# Patient Record
Sex: Female | Born: 1965 | Race: White | Hispanic: No | Smoking: Current every day smoker
Health system: Southern US, Community
[De-identification: ages and names within clinical notes are randomized; demographics above are authoritative.]

## PROBLEM LIST (undated history)

## (undated) DIAGNOSIS — F3181 Bipolar II disorder: Secondary | ICD-10-CM

## (undated) DIAGNOSIS — Z87442 Personal history of urinary calculi: Secondary | ICD-10-CM

## (undated) DIAGNOSIS — F329 Major depressive disorder, single episode, unspecified: Secondary | ICD-10-CM

## (undated) DIAGNOSIS — K219 Gastro-esophageal reflux disease without esophagitis: Secondary | ICD-10-CM

## (undated) DIAGNOSIS — F32A Depression, unspecified: Secondary | ICD-10-CM

## (undated) DIAGNOSIS — F1910 Other psychoactive substance abuse, uncomplicated: Secondary | ICD-10-CM

## (undated) DIAGNOSIS — F909 Attention-deficit hyperactivity disorder, unspecified type: Secondary | ICD-10-CM

## (undated) HISTORY — PX: KIDNEY STONE SURGERY: SHX686

## (undated) HISTORY — PX: ABDOMINAL HYSTERECTOMY: SHX81

---

## 1997-12-04 ENCOUNTER — Emergency Department (HOSPITAL_COMMUNITY): Admission: RE | Admit: 1997-12-04 | Discharge: 1997-12-04 | Payer: Self-pay | Admitting: Emergency Medicine

## 1998-05-14 ENCOUNTER — Emergency Department (HOSPITAL_COMMUNITY): Admission: EM | Admit: 1998-05-14 | Discharge: 1998-05-14 | Payer: Self-pay | Admitting: Emergency Medicine

## 1998-05-24 ENCOUNTER — Ambulatory Visit (HOSPITAL_COMMUNITY): Admission: RE | Admit: 1998-05-24 | Discharge: 1998-05-24 | Payer: Self-pay | Admitting: Internal Medicine

## 1998-05-27 ENCOUNTER — Emergency Department (HOSPITAL_COMMUNITY): Admission: EM | Admit: 1998-05-27 | Discharge: 1998-05-27 | Payer: Self-pay | Admitting: Emergency Medicine

## 1998-05-28 ENCOUNTER — Emergency Department (HOSPITAL_COMMUNITY): Admission: EM | Admit: 1998-05-28 | Discharge: 1998-05-28 | Payer: Self-pay | Admitting: Emergency Medicine

## 1998-05-31 ENCOUNTER — Emergency Department (HOSPITAL_COMMUNITY): Admission: EM | Admit: 1998-05-31 | Discharge: 1998-05-31 | Payer: Self-pay | Admitting: Emergency Medicine

## 1998-06-02 ENCOUNTER — Emergency Department (HOSPITAL_COMMUNITY): Admission: EM | Admit: 1998-06-02 | Discharge: 1998-06-02 | Payer: Self-pay | Admitting: Emergency Medicine

## 1998-06-03 ENCOUNTER — Emergency Department (HOSPITAL_COMMUNITY): Admission: EM | Admit: 1998-06-03 | Discharge: 1998-06-03 | Payer: Self-pay | Admitting: Emergency Medicine

## 1998-07-06 ENCOUNTER — Emergency Department (HOSPITAL_COMMUNITY): Admission: EM | Admit: 1998-07-06 | Discharge: 1998-07-06 | Payer: Self-pay | Admitting: Emergency Medicine

## 1998-07-08 ENCOUNTER — Inpatient Hospital Stay (HOSPITAL_COMMUNITY): Admission: EM | Admit: 1998-07-08 | Discharge: 1998-07-11 | Payer: Self-pay | Admitting: Emergency Medicine

## 1998-08-19 ENCOUNTER — Emergency Department (HOSPITAL_COMMUNITY): Admission: EM | Admit: 1998-08-19 | Discharge: 1998-08-19 | Payer: Self-pay | Admitting: Emergency Medicine

## 1998-08-19 ENCOUNTER — Encounter: Payer: Self-pay | Admitting: Emergency Medicine

## 1998-12-12 ENCOUNTER — Emergency Department (HOSPITAL_COMMUNITY): Admission: EM | Admit: 1998-12-12 | Discharge: 1998-12-12 | Payer: Self-pay | Admitting: Emergency Medicine

## 1999-02-24 ENCOUNTER — Emergency Department (HOSPITAL_COMMUNITY): Admission: EM | Admit: 1999-02-24 | Discharge: 1999-02-24 | Payer: Self-pay | Admitting: Emergency Medicine

## 1999-03-03 ENCOUNTER — Encounter: Payer: Self-pay | Admitting: Emergency Medicine

## 1999-03-03 ENCOUNTER — Emergency Department (HOSPITAL_COMMUNITY): Admission: EM | Admit: 1999-03-03 | Discharge: 1999-03-03 | Payer: Self-pay | Admitting: Emergency Medicine

## 1999-03-06 ENCOUNTER — Encounter: Payer: Self-pay | Admitting: Emergency Medicine

## 1999-03-06 ENCOUNTER — Emergency Department (HOSPITAL_COMMUNITY): Admission: EM | Admit: 1999-03-06 | Discharge: 1999-03-06 | Payer: Self-pay | Admitting: Emergency Medicine

## 1999-03-19 ENCOUNTER — Emergency Department (HOSPITAL_COMMUNITY): Admission: EM | Admit: 1999-03-19 | Discharge: 1999-03-19 | Payer: Self-pay | Admitting: Emergency Medicine

## 1999-04-02 ENCOUNTER — Emergency Department (HOSPITAL_COMMUNITY): Admission: EM | Admit: 1999-04-02 | Discharge: 1999-04-02 | Payer: Self-pay

## 1999-05-13 ENCOUNTER — Encounter: Payer: Self-pay | Admitting: Emergency Medicine

## 1999-05-13 ENCOUNTER — Emergency Department (HOSPITAL_COMMUNITY): Admission: EM | Admit: 1999-05-13 | Discharge: 1999-05-13 | Payer: Self-pay | Admitting: Emergency Medicine

## 1999-06-13 ENCOUNTER — Emergency Department (HOSPITAL_COMMUNITY): Admission: EM | Admit: 1999-06-13 | Discharge: 1999-06-13 | Payer: Self-pay | Admitting: Emergency Medicine

## 1999-09-16 ENCOUNTER — Encounter: Payer: Self-pay | Admitting: Emergency Medicine

## 1999-09-16 ENCOUNTER — Emergency Department (HOSPITAL_COMMUNITY): Admission: EM | Admit: 1999-09-16 | Discharge: 1999-09-16 | Payer: Self-pay | Admitting: Emergency Medicine

## 1999-09-29 ENCOUNTER — Encounter: Payer: Self-pay | Admitting: Emergency Medicine

## 1999-09-29 ENCOUNTER — Emergency Department (HOSPITAL_COMMUNITY): Admission: EM | Admit: 1999-09-29 | Discharge: 1999-09-29 | Payer: Self-pay | Admitting: Emergency Medicine

## 1999-10-30 ENCOUNTER — Inpatient Hospital Stay: Admission: EM | Admit: 1999-10-30 | Discharge: 1999-10-30 | Payer: Self-pay

## 1999-10-31 ENCOUNTER — Inpatient Hospital Stay (HOSPITAL_COMMUNITY): Admission: AD | Admit: 1999-10-31 | Discharge: 1999-11-06 | Payer: Self-pay | Admitting: *Deleted

## 2000-09-11 ENCOUNTER — Emergency Department (HOSPITAL_COMMUNITY): Admission: EM | Admit: 2000-09-11 | Discharge: 2000-09-11 | Payer: Self-pay | Admitting: Emergency Medicine

## 2000-09-11 ENCOUNTER — Encounter: Payer: Self-pay | Admitting: Emergency Medicine

## 2000-10-07 ENCOUNTER — Emergency Department (HOSPITAL_COMMUNITY): Admission: EM | Admit: 2000-10-07 | Discharge: 2000-10-07 | Payer: Self-pay | Admitting: Emergency Medicine

## 2000-10-12 ENCOUNTER — Emergency Department (HOSPITAL_COMMUNITY): Admission: EM | Admit: 2000-10-12 | Discharge: 2000-10-13 | Payer: Self-pay | Admitting: Emergency Medicine

## 2000-11-19 ENCOUNTER — Emergency Department (HOSPITAL_COMMUNITY): Admission: EM | Admit: 2000-11-19 | Discharge: 2000-11-20 | Payer: Self-pay | Admitting: Emergency Medicine

## 2000-11-19 ENCOUNTER — Encounter: Payer: Self-pay | Admitting: Emergency Medicine

## 2001-05-18 ENCOUNTER — Emergency Department (HOSPITAL_COMMUNITY): Admission: EM | Admit: 2001-05-18 | Discharge: 2001-05-18 | Payer: Self-pay | Admitting: Emergency Medicine

## 2001-05-25 ENCOUNTER — Emergency Department (HOSPITAL_COMMUNITY): Admission: EM | Admit: 2001-05-25 | Discharge: 2001-05-25 | Payer: Self-pay | Admitting: Emergency Medicine

## 2001-06-20 ENCOUNTER — Encounter: Payer: Self-pay | Admitting: Endocrinology

## 2001-06-20 ENCOUNTER — Encounter: Admission: RE | Admit: 2001-06-20 | Discharge: 2001-06-20 | Payer: Self-pay | Admitting: Endocrinology

## 2002-03-09 ENCOUNTER — Emergency Department (HOSPITAL_COMMUNITY): Admission: EM | Admit: 2002-03-09 | Discharge: 2002-03-09 | Payer: Self-pay | Admitting: Emergency Medicine

## 2002-04-03 ENCOUNTER — Emergency Department (HOSPITAL_COMMUNITY): Admission: EM | Admit: 2002-04-03 | Discharge: 2002-04-03 | Payer: Self-pay | Admitting: Emergency Medicine

## 2002-04-03 ENCOUNTER — Encounter: Payer: Self-pay | Admitting: Emergency Medicine

## 2002-05-31 ENCOUNTER — Emergency Department (HOSPITAL_COMMUNITY): Admission: EM | Admit: 2002-05-31 | Discharge: 2002-05-31 | Payer: Self-pay | Admitting: *Deleted

## 2002-10-04 ENCOUNTER — Encounter: Payer: Self-pay | Admitting: Emergency Medicine

## 2002-10-04 ENCOUNTER — Emergency Department (HOSPITAL_COMMUNITY): Admission: EM | Admit: 2002-10-04 | Discharge: 2002-10-04 | Payer: Self-pay | Admitting: Emergency Medicine

## 2002-11-09 ENCOUNTER — Encounter: Payer: Self-pay | Admitting: Emergency Medicine

## 2002-11-09 ENCOUNTER — Emergency Department (HOSPITAL_COMMUNITY): Admission: EM | Admit: 2002-11-09 | Discharge: 2002-11-09 | Payer: Self-pay | Admitting: Emergency Medicine

## 2002-12-21 ENCOUNTER — Emergency Department (HOSPITAL_COMMUNITY): Admission: EM | Admit: 2002-12-21 | Discharge: 2002-12-21 | Payer: Self-pay

## 2003-01-27 ENCOUNTER — Encounter: Payer: Self-pay | Admitting: Emergency Medicine

## 2003-01-27 ENCOUNTER — Emergency Department (HOSPITAL_COMMUNITY): Admission: EM | Admit: 2003-01-27 | Discharge: 2003-01-27 | Payer: Self-pay | Admitting: Emergency Medicine

## 2003-06-19 ENCOUNTER — Emergency Department (HOSPITAL_COMMUNITY): Admission: AD | Admit: 2003-06-19 | Discharge: 2003-06-19 | Payer: Self-pay | Admitting: Emergency Medicine

## 2003-07-01 ENCOUNTER — Emergency Department (HOSPITAL_COMMUNITY): Admission: EM | Admit: 2003-07-01 | Discharge: 2003-07-02 | Payer: Self-pay

## 2003-12-21 ENCOUNTER — Emergency Department (HOSPITAL_COMMUNITY): Admission: EM | Admit: 2003-12-21 | Discharge: 2003-12-21 | Payer: Self-pay | Admitting: Emergency Medicine

## 2004-01-01 ENCOUNTER — Emergency Department (HOSPITAL_COMMUNITY): Admission: EM | Admit: 2004-01-01 | Discharge: 2004-01-02 | Payer: Self-pay | Admitting: *Deleted

## 2004-01-16 ENCOUNTER — Emergency Department (HOSPITAL_COMMUNITY): Admission: EM | Admit: 2004-01-16 | Discharge: 2004-01-16 | Payer: Self-pay | Admitting: Emergency Medicine

## 2004-03-02 ENCOUNTER — Emergency Department (HOSPITAL_COMMUNITY): Admission: EM | Admit: 2004-03-02 | Discharge: 2004-03-03 | Payer: Self-pay | Admitting: Emergency Medicine

## 2004-03-31 ENCOUNTER — Emergency Department (HOSPITAL_COMMUNITY): Admission: EM | Admit: 2004-03-31 | Discharge: 2004-03-31 | Payer: Self-pay | Admitting: Emergency Medicine

## 2004-04-08 ENCOUNTER — Emergency Department (HOSPITAL_COMMUNITY): Admission: EM | Admit: 2004-04-08 | Discharge: 2004-04-09 | Payer: Self-pay | Admitting: Emergency Medicine

## 2004-09-02 ENCOUNTER — Emergency Department (HOSPITAL_COMMUNITY): Admission: EM | Admit: 2004-09-02 | Discharge: 2004-09-02 | Payer: Self-pay | Admitting: Emergency Medicine

## 2004-09-02 ENCOUNTER — Emergency Department (HOSPITAL_COMMUNITY): Admission: EM | Admit: 2004-09-02 | Discharge: 2004-09-03 | Payer: Self-pay | Admitting: Emergency Medicine

## 2004-11-22 ENCOUNTER — Emergency Department (HOSPITAL_COMMUNITY): Admission: EM | Admit: 2004-11-22 | Discharge: 2004-11-23 | Payer: Self-pay | Admitting: Emergency Medicine

## 2005-02-12 ENCOUNTER — Observation Stay (HOSPITAL_COMMUNITY): Admission: EM | Admit: 2005-02-12 | Discharge: 2005-02-13 | Payer: Self-pay | Admitting: Emergency Medicine

## 2005-02-12 ENCOUNTER — Ambulatory Visit: Payer: Self-pay | Admitting: Infectious Diseases

## 2005-02-12 ENCOUNTER — Ambulatory Visit: Payer: Self-pay | Admitting: Critical Care Medicine

## 2005-03-11 ENCOUNTER — Emergency Department (HOSPITAL_COMMUNITY): Admission: EM | Admit: 2005-03-11 | Discharge: 2005-03-11 | Payer: Self-pay | Admitting: Emergency Medicine

## 2005-03-27 ENCOUNTER — Emergency Department (HOSPITAL_COMMUNITY): Admission: EM | Admit: 2005-03-27 | Discharge: 2005-03-27 | Payer: Self-pay | Admitting: Emergency Medicine

## 2005-09-18 ENCOUNTER — Emergency Department (HOSPITAL_COMMUNITY): Admission: EM | Admit: 2005-09-18 | Discharge: 2005-09-19 | Payer: Self-pay | Admitting: Emergency Medicine

## 2005-09-19 ENCOUNTER — Emergency Department (HOSPITAL_COMMUNITY): Admission: EM | Admit: 2005-09-19 | Discharge: 2005-09-19 | Payer: Self-pay | Admitting: Emergency Medicine

## 2006-07-21 ENCOUNTER — Emergency Department (HOSPITAL_COMMUNITY): Admission: EM | Admit: 2006-07-21 | Discharge: 2006-07-21 | Payer: Self-pay | Admitting: Emergency Medicine

## 2006-07-23 ENCOUNTER — Ambulatory Visit (HOSPITAL_COMMUNITY): Admission: RE | Admit: 2006-07-23 | Discharge: 2006-07-23 | Payer: Self-pay | Admitting: Emergency Medicine

## 2006-08-18 ENCOUNTER — Emergency Department (HOSPITAL_COMMUNITY): Admission: EM | Admit: 2006-08-18 | Discharge: 2006-08-18 | Payer: Self-pay | Admitting: Emergency Medicine

## 2006-08-27 ENCOUNTER — Emergency Department (HOSPITAL_COMMUNITY): Admission: EM | Admit: 2006-08-27 | Discharge: 2006-08-27 | Payer: Self-pay | Admitting: Family Medicine

## 2006-10-07 ENCOUNTER — Ambulatory Visit: Payer: Self-pay | Admitting: Internal Medicine

## 2006-10-10 ENCOUNTER — Ambulatory Visit: Payer: Self-pay | Admitting: *Deleted

## 2006-10-11 ENCOUNTER — Ambulatory Visit: Payer: Self-pay | Admitting: Internal Medicine

## 2006-10-21 ENCOUNTER — Ambulatory Visit: Payer: Self-pay | Admitting: Internal Medicine

## 2006-11-18 ENCOUNTER — Ambulatory Visit: Payer: Self-pay | Admitting: Internal Medicine

## 2006-12-27 ENCOUNTER — Ambulatory Visit: Payer: Self-pay | Admitting: Internal Medicine

## 2007-01-24 ENCOUNTER — Ambulatory Visit: Payer: Self-pay | Admitting: Internal Medicine

## 2007-01-30 ENCOUNTER — Emergency Department (HOSPITAL_COMMUNITY): Admission: EM | Admit: 2007-01-30 | Discharge: 2007-01-30 | Payer: Self-pay | Admitting: Emergency Medicine

## 2007-03-07 ENCOUNTER — Ambulatory Visit: Payer: Self-pay | Admitting: Internal Medicine

## 2007-04-09 ENCOUNTER — Ambulatory Visit: Payer: Self-pay | Admitting: Internal Medicine

## 2007-05-08 ENCOUNTER — Ambulatory Visit: Payer: Self-pay | Admitting: Internal Medicine

## 2007-06-05 ENCOUNTER — Ambulatory Visit: Payer: Self-pay | Admitting: Internal Medicine

## 2007-06-08 ENCOUNTER — Emergency Department (HOSPITAL_COMMUNITY): Admission: EM | Admit: 2007-06-08 | Discharge: 2007-06-08 | Payer: Self-pay | Admitting: Emergency Medicine

## 2007-08-20 ENCOUNTER — Encounter (INDEPENDENT_AMBULATORY_CARE_PROVIDER_SITE_OTHER): Payer: Self-pay | Admitting: *Deleted

## 2007-08-28 ENCOUNTER — Ambulatory Visit: Payer: Self-pay | Admitting: Internal Medicine

## 2007-10-09 ENCOUNTER — Ambulatory Visit: Payer: Self-pay | Admitting: Internal Medicine

## 2007-12-24 ENCOUNTER — Ambulatory Visit: Payer: Self-pay | Admitting: Internal Medicine

## 2008-04-08 ENCOUNTER — Ambulatory Visit: Payer: Self-pay | Admitting: Internal Medicine

## 2008-07-15 ENCOUNTER — Ambulatory Visit: Payer: Self-pay | Admitting: Internal Medicine

## 2008-07-18 ENCOUNTER — Emergency Department (HOSPITAL_COMMUNITY): Admission: EM | Admit: 2008-07-18 | Discharge: 2008-07-19 | Payer: Self-pay | Admitting: Emergency Medicine

## 2008-07-19 ENCOUNTER — Inpatient Hospital Stay (HOSPITAL_COMMUNITY): Admission: AD | Admit: 2008-07-19 | Discharge: 2008-07-23 | Payer: Self-pay | Admitting: *Deleted

## 2008-07-19 ENCOUNTER — Ambulatory Visit: Payer: Self-pay | Admitting: *Deleted

## 2008-08-18 ENCOUNTER — Ambulatory Visit: Payer: Self-pay | Admitting: Internal Medicine

## 2008-09-16 ENCOUNTER — Inpatient Hospital Stay (HOSPITAL_COMMUNITY): Admission: RE | Admit: 2008-09-16 | Discharge: 2008-09-20 | Payer: Self-pay | Admitting: Psychiatry

## 2008-09-16 ENCOUNTER — Ambulatory Visit: Payer: Self-pay | Admitting: Psychiatry

## 2008-11-11 ENCOUNTER — Ambulatory Visit: Payer: Self-pay | Admitting: Internal Medicine

## 2008-11-13 ENCOUNTER — Emergency Department (HOSPITAL_COMMUNITY): Admission: EM | Admit: 2008-11-13 | Discharge: 2008-11-14 | Payer: Self-pay | Admitting: Emergency Medicine

## 2008-11-14 ENCOUNTER — Ambulatory Visit: Payer: Self-pay | Admitting: Psychiatry

## 2008-11-14 ENCOUNTER — Inpatient Hospital Stay (HOSPITAL_COMMUNITY): Admission: AD | Admit: 2008-11-14 | Discharge: 2008-11-18 | Payer: Self-pay | Admitting: Psychiatry

## 2008-11-18 ENCOUNTER — Encounter (HOSPITAL_COMMUNITY): Payer: Self-pay | Admitting: Psychiatry

## 2008-11-18 ENCOUNTER — Inpatient Hospital Stay (HOSPITAL_COMMUNITY): Admission: EM | Admit: 2008-11-18 | Discharge: 2008-11-22 | Payer: Self-pay | Admitting: Emergency Medicine

## 2008-11-22 ENCOUNTER — Ambulatory Visit: Payer: Self-pay | Admitting: *Deleted

## 2008-11-22 ENCOUNTER — Inpatient Hospital Stay (HOSPITAL_COMMUNITY): Admission: RE | Admit: 2008-11-22 | Discharge: 2008-11-25 | Payer: Self-pay | Admitting: *Deleted

## 2008-12-20 ENCOUNTER — Emergency Department (HOSPITAL_COMMUNITY): Admission: EM | Admit: 2008-12-20 | Discharge: 2008-12-21 | Payer: Self-pay | Admitting: Emergency Medicine

## 2009-01-21 ENCOUNTER — Inpatient Hospital Stay (HOSPITAL_COMMUNITY): Admission: EM | Admit: 2009-01-21 | Discharge: 2009-01-31 | Payer: Self-pay | Admitting: Psychiatry

## 2009-01-21 ENCOUNTER — Ambulatory Visit: Payer: Self-pay | Admitting: Psychiatry

## 2009-01-21 ENCOUNTER — Emergency Department (HOSPITAL_COMMUNITY): Admission: EM | Admit: 2009-01-21 | Discharge: 2009-01-21 | Payer: Self-pay | Admitting: Emergency Medicine

## 2009-01-25 ENCOUNTER — Ambulatory Visit (HOSPITAL_COMMUNITY): Admission: RE | Admit: 2009-01-25 | Discharge: 2009-01-25 | Payer: Self-pay | Admitting: Psychiatry

## 2009-03-19 ENCOUNTER — Emergency Department (HOSPITAL_COMMUNITY): Admission: EM | Admit: 2009-03-19 | Discharge: 2009-03-19 | Payer: Self-pay | Admitting: Emergency Medicine

## 2009-05-12 ENCOUNTER — Ambulatory Visit: Payer: Self-pay | Admitting: Internal Medicine

## 2010-12-24 ENCOUNTER — Encounter: Payer: Self-pay | Admitting: Emergency Medicine

## 2010-12-24 ENCOUNTER — Encounter: Payer: Self-pay | Admitting: Internal Medicine

## 2010-12-25 ENCOUNTER — Encounter: Payer: Self-pay | Admitting: Internal Medicine

## 2011-03-14 LAB — URINE MICROSCOPIC-ADD ON

## 2011-03-14 LAB — URINALYSIS, ROUTINE W REFLEX MICROSCOPIC
Specific Gravity, Urine: 1.028 (ref 1.005–1.030)
pH: 5.5 (ref 5.0–8.0)

## 2011-03-14 LAB — POCT PREGNANCY, URINE: Preg Test, Ur: NEGATIVE

## 2011-03-19 LAB — DIFFERENTIAL
Basophils Relative: 0 % (ref 0–1)
Eosinophils Relative: 7 % — ABNORMAL HIGH (ref 0–5)
Lymphs Abs: 1.9 10*3/uL (ref 0.7–4.0)
Neutrophils Relative %: 55 % (ref 43–77)

## 2011-03-19 LAB — COMPREHENSIVE METABOLIC PANEL
CO2: 28 mEq/L (ref 19–32)
Chloride: 105 mEq/L (ref 96–112)
Creatinine, Ser: 0.93 mg/dL (ref 0.4–1.2)
GFR calc non Af Amer: >60 mL/min (ref >60)
Glucose, Bld: 113 mg/dL — ABNORMAL HIGH (ref 70–99)
Sodium: 142 mEq/L (ref 135–145)
Total Protein: 7 g/dL (ref 6.0–8.3)

## 2011-03-19 LAB — CBC
HCT: 35.1 % — ABNORMAL LOW (ref 36.0–46.0)
RDW: 15.8 % — ABNORMAL HIGH (ref 11.5–15.5)
WBC: 6.2 10*3/uL (ref 4.0–10.5)

## 2011-03-19 LAB — LIPASE, BLOOD: Lipase: 26 U/L (ref 11–59)

## 2011-03-20 LAB — RAPID URINE DRUG SCREEN, HOSP PERFORMED
Cocaine: NOT DETECTED
Opiates: NOT DETECTED
Tetrahydrocannabinol: NOT DETECTED

## 2011-03-20 LAB — URINALYSIS, ROUTINE W REFLEX MICROSCOPIC
Bilirubin Urine: NEGATIVE
Glucose, UA: NEGATIVE mg/dL
Ketones, ur: NEGATIVE mg/dL
Nitrite: NEGATIVE
Protein, ur: NEGATIVE mg/dL
Protein, ur: NEGATIVE mg/dL
Urobilinogen, UA: 0.2 mg/dL (ref 0.0–1.0)

## 2011-03-20 LAB — DIFFERENTIAL
Basophils Absolute: 0 10*3/uL (ref 0.0–0.1)
Basophils Relative: 1 % (ref 0–1)
Eosinophils Absolute: 0.2 10*3/uL (ref 0.0–0.7)
Eosinophils Absolute: 0.2 10*3/uL (ref 0.0–0.7)
Eosinophils Relative: 2 % (ref 0–5)
Eosinophils Relative: 3 % (ref 0–5)
Lymphs Abs: 1.4 10*3/uL (ref 0.7–4.0)

## 2011-03-20 LAB — CBC
HCT: 32.3 % — ABNORMAL LOW (ref 36.0–46.0)
HCT: 33.1 % — ABNORMAL LOW (ref 36.0–46.0)
MCV: 79 fL (ref 78.0–100.0)
MCV: 80 fL (ref 78.0–100.0)
Platelets: 273 10*3/uL (ref 150–400)
Platelets: 291 10*3/uL (ref 150–400)
RDW: 16 % — ABNORMAL HIGH (ref 11.5–15.5)
WBC: 6.5 10*3/uL (ref 4.0–10.5)

## 2011-03-20 LAB — BASIC METABOLIC PANEL
BUN: 9 mg/dL (ref 6–23)
Chloride: 103 mEq/L (ref 96–112)
Glucose, Bld: 69 mg/dL — ABNORMAL LOW (ref 70–99)
Potassium: 4.3 mEq/L (ref 3.5–5.1)

## 2011-03-20 LAB — PREGNANCY, URINE: Preg Test, Ur: NEGATIVE

## 2011-04-17 NOTE — Discharge Summary (Signed)
NAMECAIDEN, MONSIVAIS               ACCOUNT NO.:  [id number]   MEDICAL RECORD NO.:  [id number]          PATIENT TYPE:  IPS   LOCATION:  0504                          FACILITY:  [facility]   PHYSICIAN:  Geoffery Lyons, M.D.      DATE OF BIRTH:  04/05/1966   DATE OF ADMISSION:  01/21/2009  DATE OF DISCHARGE:  01/31/2009                               DISCHARGE SUMMARY   CHIEF COMPLAINT AND PRESENT ILLNESS:  This was one of several admissions  to [facility] for this 45 year old divorced white  female.  According to the EMS, they were called to the methadone clinic  as they felt she was suicidal.  She had an argument with her daughter  the night before, had not gotten any sleep due to Klonopin along with  her methadone 125 mg per day.  She denied being suicidal, or homicidal  ideas.  Endorsed she was upset about the fight with her daughter.  She  was drowsy.  Her speech was slurred.  Claimed that the methadone helps  with the craving, that she continues to use benzodiazepines.  She gets  Klonopin 3 times a day, methadone 125 mg per day.  They had increased it  to 130 but they decreased it back at she was a little drowsy.  She also  takes Toradol 200 mg per day.  UDS positive for benzodiazepines.   PAST PSYCHIATRIC HISTORY:  __________ 2004.  Methadone clinic since  February 2009.  Last admission to Behavior Health in October.   ALCOHOL AND DRUG HISTORY:  On maintenance treatment with methadone as  well as opioids, Ambien, as well as benzodiazepines.   MEDICATIONS:  1. Zoloft 200 mg per day.  2. Seroquel 200 mg at bedtime and 200 mg twice a day.  3. Klonopin 0.5 every 8 hours as needed.  4. Methadone from the methadone clinic 125 mg per day.   PHYSICAL EXAMINATION:  Failed to show any acute findings.   OTHER LABORATORY WORKUP:  White blood cells 6.5, hemoglobin 10.5.  UA:  White blood cells 21-50.   MENTAL STATUS EXAMINATION:  Revealed alert, cooperative female, casually  groomed and dressed.  Speech was normal in rate, tempo and production.  Mood anxious, depressed, affect depressed, tearful.  Thought processes  are clear, rational and goal oriented.  No active suicidal or homicidal  ideas, no delusions.  No hallucinations.  Cognition well-preserved.   ADMISSION DIAGNOSES:  Axis I:  A.  Major depressive disorder.  B.  Opiate dependence on maintenance with methadone.  Axis II:  No diagnosis.  Axis III:  Degenerative disk disease.  Axis IV:  Moderate.  Axis V:  Upon admission 35-40, highest GAF in the last year 60.   COURSE IN THE HOSPITAL:  She was admitted.  She was started in  individual and group psychotherapy.  She was maintained on her  medications.  She was treated with Cipro for the urinary tract  infection.  She was eventually placed on Wellbutrin.  She did admit to  depression and anxiety.  January 24, 2009  she continued to deal  with  all the environmental stressors, frustrated because she has not been  able to get a place to live where she can get her daughter to stay with  her and be away from the female she lives with, which is a very stressful  situation.  She had developed some cough, was assessed by the nurse  practitioner.  She has prior history of pneumonia.  January 17, 2009  she continued to have a hard time with depression in that she was going  down, becoming suicidal.  We pursued the Wellbutrin.  On the 26th  continued to endorse a depressed mood, endorsed fluctuation with the  depression,with suicidal thought, but did say she would not hurt  herself.  January 29, 2009 endorsed that she might have seen a decrease  in the suicidal thoughts.  No side effects to the medication.  Still  underlying depressed, but endorsed she was maybe starting to feel some  better.  The next 48 hours she improved, marked decrease in the  depression, no suicidal thoughts.  So on January 31, 2009 we went ahead and  discharged to outpatient treatment.    DISCHARGE DIAGNOSES:  Axis I:  A.  Major depressive disorder.  1. Mood disorder, not otherwise specified.  2. Anxiety disorder, not otherwise specified.  3. Opiate dependency, maintenance treatment with methadone.  Axis II:  No diagnosis.  Axis III:  Degenerative disk disease.  Axis IV:  Moderate.  Axis V:  Upon discharge 55-60.   Discharged on:  1. Zoloft 200 mg per day.  2. Wellbutrin XL 150 mg per day.  3. Protonix 40 mg per day.  4. Seroquel 200 mg twice a day and 400 mg at bedtime.  5. Methadone 120 mg per day.  6. Klonopin 0.5 twice a day and at night as needed.  7. Flovent 2 puffs twice a day.  8. Albuterol inhaler 2 puffs 3 times daily as needed.   FOLLOWUP:  By Dala Dock and Dr. Lang Snow at [facility].      Geoffery Lyons, M.D.  Electronically Signed     IL/MEDQ  D:  02/25/2009  T:  02/25/2009  Job:  [id number]

## 2011-04-17 NOTE — H&P (Signed)
[name], MORDAN NO.:  [id number]   MEDICAL RECORD NO.:  [id number]          PATIENT TYPE:  IPS   LOCATION:  0307                          FACILITY:  [facility]   PHYSICIAN:  Jasmine Pang, M.D. DATE OF BIRTH:  05-26-1966   DATE OF ADMISSION:  07/19/2008  DATE OF DISCHARGE:                       PSYCHIATRIC ADMISSION ASSESSMENT   A 45 year old female that is voluntarily on July 18, 2008.   HISTORY OF PRESENT ILLNESS:  The patient reports that she has been  depressed and said that she just could not take any more.  She has  been having so much going on in her life that her brother is ill, her  Asa Lente is due and having financial issues.  She was experiencing  racing thoughts,  mood swings, feeling again very depressed and anxious,  endorsing panic attacks.  Her sleep has been decreased.  Appetite has  been satisfactory.   PAST PSYCHIATRIC HISTORY:  First admission to [facility].  No  current outpatient mental health treatment.  She is currently in a  methadone treatment center.  Her past medications have included  trazodone which she reports problems with dreams.   SOCIAL HISTORY:  A 45 year old female.  She is currently living with her  brother and lives in [place].  She is unemployed.  She has a 19-year-  old daughter and 2 sons ages 38 and 20.  The patient states she is  trying to get disability for psychiatric issues.   FAMILY HISTORY:  Unclear.   ALCOHOL/DRUG HISTORY:  The patient smokes.  No alcohol use.  Denies any  substance use.   PRIMARY CARE Helen Cuff:  [facility], sees Dr. Reche Dixon.   MEDICAL PROBLEMS:  Chronic pain.   MEDICATIONS:  1. Methadone 80 mg daily.  2. Zoloft 100 mg prescribed by Dr. Reche Dixon.  3. Klonopin 1 mg t.i.d. prescribed by [facility].   DRUG ALLERGIES:  PENICILLIN.   PHYSICAL EXAMINATION:  The patient was fully assessed at [facility]  Emergency Department.  She did receive Xanax.  Her temperature is  97.9,  100 heart rate, 18 respirations, blood pressure is 129/73, 65 inches  tall, 160 pounds.   LABORATORY DATA:  Glucose of 112.  Urinalysis is negative.  Alcohol  level less than 5.  Urine drug screen is positive for opiates, positive  for benzodiazepines.   MENTAL STATUS EXAM:  The patient is alert, awake, casually dressed, good  eye contact, appears very anxious.  Her speech is clear, normal pace and  tone.  Her thought processes are coherent.  No evidence of psychosis.  No delusional statements.  Cognitive function is intact.  Her judgment  and insight appear to be good.   AXIS I:  Major depressive disorder.  AXIS II:  Deferred.  AXIS III:  Chronic pain.  AXIS IV:  Psychosocial problems.  Medical problems.  AXIS V:  Current is 40.   PLAN:  Contract for safety.  Stabilize mood and thinking.  We will  clarify her medications.  We will continue with the patient's Zoloft.  The patient may benefit from Abilify to  augment her Zoloft.  She will be  in the blue group.  We will continue to assess comorbidities and  identify her support.  Tentative length of stay is 2-3 days.      Landry Corporal, N.P.      Jasmine Pang, M.D.  Electronically Signed    JO/MEDQ  D:  07/20/2008  T:  07/20/2008  Job:  [id number]

## 2011-04-17 NOTE — Consult Note (Signed)
[facility]               ACCOUNT NO.:  [id number]   MEDICAL RECORD NO.:  [id number]          PATIENT TYPE:  INP   LOCATION:  1532                         FACILITY:  [facility]   PHYSICIAN:  Antonietta Breach, M.D.  DATE OF BIRTH:  February 05, 1966   DATE OF CONSULTATION:  11/22/2008  DATE OF DISCHARGE:                                 CONSULTATION   Ms. Milhouse does continue with depressed mood, poor energy, difficulty  concentrating, anhedonia and suicidal thoughts.   MENTAL STATUS EXAM:  During the examination the patient begins to sob,  discussing how she would be at risk for suicide outside the hospital.  She is alert.  Her eye contact is intermittent.  Her attention span is  normal.  However, her concentration is decreased.  Affect is constricted  with tears.  Mood is depressed.  She is oriented to all spheres.  Memory  is intact to immediate, recent and remote.  Fund of knowledge and  intelligence normal.  Speech involves normal rate and prosody without  dysarthria.  Thought process is logical, coherent, goal-directed without  looseness of associations.  Thought content as above.  Insight is intact  for depression.  Judgment is intact.   ASSESSMENT:  Axis I:  293.83  Mood disorder not otherwise specified, severe depression, rule  out major depressive disorder.  History of the opioid dependence.   Ms. Popson is still at risk for suicide.   RECOMMENDATIONS:  1. Would admit to an inpatient psychiatric ward for further evaluation      and treatment.  2. No psychotropic change recommendations at this time.      Antonietta Breach, M.D.  Electronically Signed     JW/MEDQ  D:  11/22/2008  T:  11/22/2008  Job:  [id number]

## 2011-04-17 NOTE — Discharge Summary (Signed)
[name], EDICK               ACCOUNT NO.:  [id number]   MEDICAL RECORD NO.:  [id number]          PATIENT TYPE:  INP   LOCATION:  1532                         [facility]ACILITY:  [facility]   PHYSICIAN:  Altha Harm, MDDATE OF BIRTH:  [birth year]   DATE OF ADMISSION:  11/18/2008  DATE OF DISCHARGE:  11/22/2008                               DISCHARGE SUMMARY   DISCHARGE DISPOSITION:  Inpatient behavioral health.   DISCHARGE DIAGNOSES:  1. Community-acquired pneumonia.  2. Opioid dependence.  3. Depression and suicidal ideation.   DISCHARGE MEDICATIONS:  1. Klonopin 0.5 mg p.o. b.i.d. and 0.5 mg p.o. nightly.  2. Seroquel 50 mg p.o. q.6 hours and 50 mg p.o. nightly.  3. Trazodone 50 mg p.o. nightly.  4. Methadone 110 mg p.o. daily.  5. Zoloft 200 mg p.o. daily in the a.m.  6. Avelox 400 mg p.o. daily x2.   CONSULTANTS:  Dr. Antonietta Breach, psychiatry.   PROCEDURES:  None.   DIAGNOSTIC STUDIES:  A 2-view chest x-ray done on admission which shows  normal densities in the right middle lobe and right lower lobe, probable  acute pneumonia.   CODE STATUS:  Full Code.   ALLERGIES:  PENICILLIN.   CHIEF COMPLAINT:  Feeling bad, cough, pleurisy, fevers and chills.   HISTORY OF PRESENT ILLNESS:  Refer to the history and physical dictated  by Dr. Elliot Cousin for details of the HPI.   HOSPITAL COURSE:  1. The patient was admitted and started on IV antibiotics.  She      improved clinically with resolution of fever and body aches.  The      patient was then transitioned on oral Avelox and she is to complete      2 additional days, total of 7 days.  2. In addition, the patient was felt to possibly have the flu.  She      was started on Tamiflu.  The patient does not need to continue on      Tamiflu any further.  3. Suicidal ideations and major depression.  The patient was evaluated      by psychiatry for need for return to inpatient behavioral health.      It is the opinion of the  psychiatrist that the patient does need to      return to behavioral health for further evaluation and therapy.      The patient's medications are as listed above.   PHYSICAL EXAM:  VITAL SIGNS:  Otherwise, the patient is stable, afebrile  today with a temperature of 98.1, heart rate of 80, blood pressure  100/60, and respiratory rate of 16.  LUNGS:  Clear to auscultation.  ABDOMEN:  Nontender, nondistended.  No masses or hepatosplenomegaly.  CARDIAC:  She has a normal S1, S2.  No murmurs, rubs, or gallops are  noted.  PMI is not displaced.   DISPOSITION:  1. The patient to be discharged back to the inpatient behavioral      health.  2. Dietary restrictions.  The patient has no dietary restrictions.  3. Physical restrictions.  None.  The patient ambulates without  assistance.  4. Followup.  The patient is being transferred back to inpatient      behavioral health where she can follow up.      Altha Harm, MD  Electronically Signed     MAM/MEDQ  D:  11/22/2008  T:  11/22/2008  Job:  [id number]   cc:   Behavioral Health

## 2011-04-17 NOTE — H&P (Signed)
[name], [name]               ACCOUNT NO.:  [id number]   MEDICAL RECORD NO.:  [id number]          PATIENT TYPE:  INP   LOCATION:  0109                         FACILITY:  [facility]   PHYSICIAN:  Elliot Cousin, M.D.    DATE OF BIRTH:  1966-08-31   DATE OF ADMISSION:  11/18/2008  DATE OF DISCHARGE:                              HISTORY & PHYSICAL   Primary care physician is Dr. Donia Guiles.   CHIEF COMPLAINT:  Feeling bad.  Also cough, pleurisy, and fever and  chills.   HISTORY OF PRESENT ILLNESS:  The patient is a 45 year old woman with a  past medical history significant for depression with suicidal ideation,  opioid dependence, and chronic pain from degenerative joint disease of  the lumbar spine.  She was transferred from inpatient Behavioral Health  to the emergency department today for a chief complaint of generalized  malaise, cough, pleurisy, and fever and chills.  The patient states that  she started feeling bad approximately three and a half to four days  ago.  She developed a fever of 102 at [facility] per her history.  She says she was treated symptomatically for a viral infection, but  apparently not with Tamiflu.  Over the following two to three days, she  has developed a worsening dry cough, pleurisy (primarily on the right ),  and subjective fever and chills.  She has also developed laryngitis.  She has no complaints of a sore throat.  She has had several episodes of  nausea and vomiting which generally is precipitated by coughing.  She  has had no abdominal pain.  She has had a slight headache but no  photophobia or a stiff neck.   When the patient was evaluated in the emergency department, she was  found to be hemodynamically stable and afebrile.  Her white blood cell  count is within normal limits at 7.0.  Her chest x-ray reveals abnormal  densities in the right middle lobe and the right lower lobe, probable  acute pneumonia.  The patient will be admitted  for further evaluation  and management.   PAST MEDICAL HISTORY:  1. Depression with suicidal ideation and history of inpatient      treatment at [facility] on multiple occasions.  2. Opioid dependence/methadone dependence secondary to chronic pain.  3. Chronic pain secondary to L4-L5 degenerative joint disease. ( The      patient receives her methadone at the [facility]).  4. Status post hysterectomy secondary to endometriosis.   MEDICATIONS:  1. Methadone 110 mg daily (at 7:00 a.m.).  2..  Zoloft 200 mg daily.  1. Seroquel 50 mg every 6 hours and 50 mg at bedtime.  2. Klonopin 0.5 mg b.i.d. and 0.5 mg at bedtime.  3. Trazodone 50 mg at bedtime.   ALLERGIES:  The patient has an allergy to penicillin which causes  swelling.   FAMILY HISTORY:  The patient's mother died of lung cancer at 62 years of  age.  Her father died of a myocardial infarction at 44 years of age.   SOCIAL HISTORY:  The patient is  divorced.  She has three children.  [place]  She is currently unemployed and is  seeking employment.  She smokes half a pack cigarettes per day which is  down from one pack of cigarettes per day for many years.  She denies  alcohol and illicit drug use.   REVIEW OF SYSTEMS:  The patient's review of systems is positive for  depression and suicidal ideation.  She says that she is not suicidal  now.   PHYSICAL EXAMINATION:  Temperature 98.3, blood pressure 100/56, pulse  83, respiratory rate 20, oxygen saturation 87% on room air and 95% on 2  liters of nasal cannula oxygen.  GENERAL:  The patient is a 45 year old Caucasian woman who is currently  sitting up in bed.  She appears ill but in no acute distress.  HEENT: Head is normocephalic nontraumatic.  Pupils equal, round,  reactive to light.  Extraocular muscles are intact.  Conjunctivae are  clear.  Sclerae are white.  Tympanic membranes are clear bilaterally.  Nasal mucosa is mildly dry.   No sinus tenderness.  Oropharynx reveals  mildly dry mucous membranes.  No posterior exudates or erythema.  NECK:  Supple.  No adenopathy, no thyromegaly, no bruit or JVD.  LUNGS: A few crackles auscultated bilaterally and occasional wheezes  auscultated in the upper lobes bilaterally.  Breathing is nonlabored.  HEART:  S1-S2 with no murmurs, rubs or gallops.  ABDOMEN:  Positive bowel sounds, soft, nontender, nondistended.  No  hepatosplenomegaly, no masses palpated.  EXTREMITIES:  Pedal pulses are  palpable bilaterally.  No pretibial edema and no pedal edema.  NEUROLOGIC:  The patient is alert and oriented x3.  Cranial nerves II-  XII are intact.  Strength is 5/5 throughout.  Sensation is intact.  PSYCHOLOGICAL:  The patient is alert and oriented.  She has a pleasant  affect.  She acknowledges suicidal ideation approximately three to four  days ago.  However, with adjustments in her medications at [facility], she says that she is no longer suicidal.   ADMISSION LABORATORY DATA:  Chest x-ray results are above.  Sodium 138,  potassium 3.8, chloride 101, CO2 33, glucose 120, BUN 12, creatinine  0.96, calcium 8.8.  WBC 7.0, hemoglobin 10.4, hematocrit 32.3, platelets  231, sed rate 47.   ASSESSMENT:  1. Right-sided pneumonia.  Surprisingly, the patient is currently      afebrile and her white blood cell count is within normal limits.      It is unclear whether or not the patient has a superimposed H1N1      infection.  Her symptoms started approximately four days go.  She      is now afebrile and therefore the therapeutic yield for Tamiflu is      nearly nil at this time.  2. Tobacco abuse.  The patient was strongly advised to stop smoking.      She recently decreased her tobacco use to a half a pack of      cigarettes per day.  She does have bronchospasms on exam and may      have superimposed acute bronchitis.  3. Mild hypoxia.  The patient was mildly hypoxic with an oxygen       saturation of 87%.  With oxygen supplementation, her oxygen      saturations improved appropriately.  4. Depression with a recent history of suicidal ideation.  The patient      was an inpatient at [facility] prior to her      presentation to the emergency department.  She is being treated      with a number of psychotropic medications and these will continue.      Given the nature of her depression, suicidal precautions will be      continued with a 24-hour sitter.  Psychiatrist Dr. Jeanie Sewer should      be consulted when the patient approaches hospital discharge.  The      patient may be appropriate for transfer back to [facility]      for further treatment.  5. Chronic methadone dependency.  The patient will be continued on      methadone during this hospitalization.  We will try to avoid giving      the patient additional opioids.   PLAN:  1. The patient will be admitted for further evaluation and management.  2. She received azithromycin intravenously in the emergency      department.  Will continue antibiotic treatment with intravenous      Avelox.  3. Supportive treatment will be given with albuterol and Atrovent      nebulizers, oxygen, Robitussin DM as needed, and Mucinex.  4. Will change the patient's nicotine patch to 14 mg daily.  Tobacco      cessation counseling will be ordered.  5. Will continue methadone and chronic psychotropic medications.  Will      institute suicidal precautions and have a 24-hour sitter in the      room with the patient.  6. Will consult psychiatry when the patient is medically improved and      approaches hospital discharge.  7. Will check her thyroid function in the morning.      Elliot Cousin, M.D.  Electronically Signed     DF/MEDQ  D:  11/18/2008  T:  11/18/2008  Job:  [id number]   cc:   Dineen Kid. Reche Dixon, M.D.  Fax: [phone]

## 2011-04-17 NOTE — H&P (Signed)
[name], [name]               ACCOUNT NO.:  [id number]   MEDICAL RECORD NO.:  [id number]          PATIENT TYPE:  IPS   LOCATION:  0504                          FACILITY:  [facility]   PHYSICIAN:  Vic Ripper, P.A.-C.DATE OF BIRTH:  Jul 04, 1966   DATE OF ADMISSION:  01/21/2009  DATE OF DISCHARGE:                       PSYCHIATRIC ADMISSION ASSESSMENT   This is an involuntary admission to the services of Dr. Geoffery Lyons.   IDENTIFYING INFORMATION:  This is a 45 year old, divorced, white female.  Accordingly, the EMS were called to the [facility] as they felt  the patient was suicidal.  She stated she had an argument with her  daughter the night before.  She had not gotten any sleep.  She admitted  to taking 2 Klonopin along with her Methadone dose 125 mg.  She denied  being suicidal or homicidal.  She denied any auditory or visual  hallucinations.  She stated she was upset regarding this fight with her  daughter.  She was drowsy.  Her speech was slurred at times.  She has  had heavy drug use in the past.  Methadone helps with cravings, and she  continues to use benzos.  Now according to her, she is prescribed from  the [facility].  She gets Klonopin t.i.d. at 0.5 mg  or every 8 hours.  Her Methadone is 125 mg a day.  They had bumped it up  to 130, but it was reduced a little bit as that may be making her a  little drowsy, and she takes Zoloft 200 mg p.o. daily.  While in the  emergency room, her workup showed that her urine drug screen was  positive only for benzos, which she is prescribed, and she did have a  moderate amount of leukocyte-esterase, which would give her a UTI.   PAST PSYCHIATRIC HISTORY:  She was in [facility] in 2004.  She  began going to the [facility] in February of 2009, and she was  last with [place] here at the [facility] in October.   SOCIAL HISTORY:  She is a high Garment/textile technologist in 1986.  She has been  married  and divorced once.  She has 2 sons, ages 23 and 29.  The 18-year-  old lives with his father, the 19 year old is in the [employer].  Her 50-  year-old daughter lives with another couple.  She was last employed  about a year ago.  She currently does odd jobs and gets paid whenever  she does do odd jobs, and her brother helps her pay her Methadone fee.   FAMILY HISTORY:  Negative.   PRIMARY CARE Lazarus [place]:  She currently does not have a primary care other  than the [facility] and she is followed by [facility].   MEDICAL PROBLEMS:  She reports having had numerous surgeries in the  past, which got her addicted to opiates, and she currently suffers from  degenerative disk disease.   CURRENTLY PRESCRIBED MEDICATIONS:  1. Zoloft 200 mg p.o. q.a.m.  2. Seroquel 400 mg at h.s., 200 mg  b.i.d.  3. Klonopin 0.5 mg p.o. q.8h p.r.n.   DRUG ALLERGIES:  PENICILLIN.   POSITIVE PHYSICAL FINDINGS:  As already stated, her urinalysis showed  she had a moderate amount of leukocyte-esterase.  She was medically  cleared in the ED at [facility].  Vital signs on admission show she is  67 inches tall, weighs 185, temperature is 97, blood pressure was  106/66, pulse 94, respirations 20.  She is status post a hysterectomy in  1994, kidney stones about age 80, and foot surgery.  She does continue  to smoke a half pack of cigarettes per day.  She has had nausea and  vomiting the past 2 days, which may be part of her UTI.   MENTAL STATUS EXAM:  Today, she was alert and oriented.  She is casually  groomed and dressed.  She appears to be adequately nourished.  She walks  unaided.  Her speech was not pressured.  Her mood was depressed.  She  became tearful, actually cried.  Thought processes are clear, rational,  and goal oriented.  Judgment and insight are fair.  Concentration and  memory are intact.  Intelligence is at least average.  She denies being  suicidal or homicidal.  She  reports having unusual experiences for about  the past 3 weeks.  She feels that she sees things that really are not  there.   DIAGNOSES:   AXIS I:  Mood disorder, not otherwise specified.   AXIS II:  Deferred.   AXIS III:  Degenerative disk disease.   AXIS IV:  Relationship issues and housing.   AXIS V:  Thirty.   The plan is to admit for safety and stabilization.  We will adjust her  meds as indicated.  We will further check on the allegation that she was  misusing her Klonopin.  Her brother made this statement; however, the  bottle is at home and we cannot verify that information.  We will treat  her for her UTI with Cipro 500 mg b.i.d. x5 days.  We will also give her  some Omeprazole as she feels that she has GERD.   ESTIMATED LENGTH OF STAY:  Three to 5 days.      Vic Ripper, P.A.-C.     MD/MEDQ  D:  01/22/2009  T:  01/22/2009  Job:  [id number]
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
## 2011-04-17 NOTE — H&P (Signed)
[name], [name]               ACCOUNT NO.:  [id number]   MEDICAL RECORD NO.:  [id number]          PATIENT TYPE:  IPS   LOCATION:  0402                          FACILITY:  [facility]   PHYSICIAN:  Anselm Jungling, MD  DATE OF BIRTH:  05/22/1966   DATE OF ADMISSION:  09/16/2008  DATE OF DISCHARGE:                       PSYCHIATRIC ADMISSION ASSESSMENT   IDENTIFYING STATEMENT:  A 45-year female voluntarily admitted on September 16, 2008.   HISTORY OF PRESENT ILLNESS:  The patient presents with a history of  depression and passive suicidal thoughts with no specific plan.  She has  been off her medications since she was last in our hospital which is  approximately 2 months ago.  She states she missed a mental health  appointment.  She has been experiencing panic attacks.  She is under a  lot of stress taking care of her daughter's baby.  Her son is in the  [employer], and she is unemployed.  She reports not sleeping well.  Her  appetite has been satisfactory.  She reports some occasional auditory  hallucinations of a whispering type voice.   PAST PSYCHIATRIC HISTORY:  The patient was here approximately 2 months  ago.  She goes to the [facility] for a methadone program.   SOCIAL HISTORY:  She is a 45 year old female.  She lives in [place],  stays with her brother.  She has a daughter 53 years of age with a new  child.  Her son is in the [employer] in [place].  She states she is  unemployed and looking for work.   FAMILY HISTORY:  None.   ALCOHOL AND DRUG HISTORY:  The patient smokes.  Denies any alcohol or  drug use.  Primary care [name] is [facility].   PAST MEDICAL HISTORY:  Medical problems include history of migraine  headaches.   MEDICATIONS:  Reports being on methadone 80 mg daily.  Again, she has  been noncompliant with her Klonopin, Abilify and Zoloft 50 mg.  She has  been taking Ambien for sleep.   DRUG ALLERGIES:  PENICILLIN (REPORTS PROBLEMS WITH  SWELLING), COMPAZINE  (STATES THAT IT MAKES HER CRAZY).   REVIEW OF SYSTEMS:  Significant for insomnia, depression, anxiety,  headaches.  No seizure activity or muscle pain.   PHYSICAL EXAMINATION:  GENERAL:  This is a middle-aged female.  She is  in no acute distress.  She appears tired.  VITAL SIGNS:  Her temperature is 98, 56 heart rate, 24 respirations,  blood pressure 135/97, 67 inches tall, 166 pounds.  HEENT:  Her head is atraumatic.  Negative lymphadenopathy.  CHEST:  Clear.  Breast exam was deferred.  HEART:  Regular rate and rhythm.  No murmurs or gallops.  ABDOMEN:  Soft, nondistended, nontender abdomen.  PELVIC/GU:  Exam was deferred.  EXTREMITIES:  Moves all extremities.  No clubbing or edema.  SKIN:  Skin is warm and dry.  Tattoos were noted on the skin assessment.  NEUROLOGICAL:  Findings are intact.  Cranial nerves intact with no tics  or tremors.   LABORATORY DATA:  None is available at this  time.   MENTAL STATUS EXAM:  She is alert, cooperative, casually dressed, fair  eye contact.  Her speech is clear, normal pace and tone.  The patient's  mood is anxious.  She became momentarily tearful.  Thought processes are  coherent with no evidence of psychosis, although she did endorse  auditory hallucinations but does not appear to be responding at this  time.  Cognitive function intact.  Her memory appears to be good.  Judgment and insight are fair.   DIAGNOSES:  AXIS I:  Depressive disorder NOS.  Opiate dependence.  AXIS II:  Deferred.  AXIS III:  Migraine headaches per patient history.  AXIS IV:  Other psychosocial problems, occupation problems, possible  issues with  economic problems.  AXIS V:  Current GAF is 35-40.   PLAN:  Admit for safety.  Stabilize mood and thinking.  Reinforce  medication compliance and follow-up appointments.  We will resume her  medications, other than the Klonopin at this time.  We will get a urine  drug screen and a CBC and C-MET.  We  will clarify her medications.  We  will resume her Zoloft and Abilify at this time.  We will identify her  supports.  We will have Vistaril available for symptoms of anxiety.  The  patient be in the blue group.  Her tentative length of stay is 3-5 days.      Landry Corporal, N.P.      Anselm Jungling, MD  Electronically Signed    JO/MEDQ  D:  09/17/2008  T:  09/17/2008  Job:  [id number]

## 2011-04-20 NOTE — Discharge Summary (Signed)
[name], [name]               ACCOUNT NO.:  [id number]   MEDICAL RECORD NO.:  [id number]          PATIENT TYPE:  IPS   LOCATION:  0507                          FACILITY:  [facility]   PHYSICIAN:  Anselm Jungling, MD  DATE OF BIRTH:  1966-11-18   DATE OF ADMISSION:  11/22/2008  DATE OF DISCHARGE:  11/25/2008                               DISCHARGE SUMMARY   IDENTIFYING DATA AND REASON FOR ADMISSION:  This was the continuation of  psychiatric inpatient treatment for [name], a 45 year old female who  returned from [facility] where she had been treated for  pneumonia for 5 days.  She stated I feel much better today. when she  returned.  She also reported that her mood had improved, which she  attributed to her recent trial of Zoloft.  She was still having restless  sleep.  However, even with Seroquel and Klonopin.  She was absent any  active suicidal ideation.   The patient was continued on a regimen of Zoloft, with Seroquel  increased to 50 mg b.i.d. and 100 mg q.h.s. She was continued on  trazodone 50 mg q.h.s., and Klonopin 0.5 mg t.i.d.  She was also  continued on her usual dose of methadone.   On the fourth hospital day, the patient appeared appropriate for  discharge.  She was stable.  Her mood was much improved.  She reported  excellent sleep with Seroquel at 100 mg q.h.s.  She agreed to the  following aftercare plan.   AFTERCARE:  The patient was to follow up at [facility] with an appointment to see their psychiatrist on December 31, and  an appointment to see a therapist at [facility] on November 30, 2008 at 2:00 p.m.   DISCHARGE MEDICATIONS:  Zoloft 200 mg daily, Seroquel 50 mg b.i.d. and  100 mg q.h.s., trazodone 50 mg q.h.s., Klonopin 0.5 mg b.i.d. and  q.h.s., and methadone usual dose daily.   DISCHARGE DIAGNOSES:  AXIS I: Mood disorder, NOS.  Opiate dependence, in  remission, on methadone maintenance.  AXIS II: Deferred.  AXIS  III: No acute or chronic illnesses, status post pneumonia.  AXIS IV: Stressors severe.  AXIS V: GAF on discharge 60.      Anselm Jungling, MD  Electronically Signed     SPB/MEDQ  D:  12/05/2008  T:  12/05/2008  Job:  [id number]

## 2011-04-20 NOTE — Discharge Summary (Signed)
[name], [name]               ACCOUNT NO.:  [id number]   MEDICAL RECORD NO.:  [id number]          PATIENT TYPE:  INP   LOCATION:  4704                         FACILITY:  [facility]   PHYSICIAN:  Fransisco Hertz, M.D.  DATE OF BIRTH:  [birth year]   DATE OF ADMISSION:  02/12/2005  DATE OF DISCHARGE:  02/13/2005                                 DISCHARGE SUMMARY   DISCHARGE DIAGNOSES:  1.  Depression.  2.  Anxiety.  3.  Iron-deficiency anemia.   DISCHARGE MEDICATIONS:  1.  Xanax 0.5 mg to take 1/2 tablet p.o. at 3 p.m.  2.  Ambien 10 mg 1/2 tablet p.o. q.h.s.  3.  Ferrous sulfate 325 mg p.o. b.i.d.   CONSULTATIONS:  1.  Dr. Jeanie Sewer, psychiatry.  2.  Dr. Delford Field, critical care.   PROCEDURES:  Orotracheal intubation on February 12, 2005 for airway  maintenance.   HOSPITAL COURSE:  [name] is a 45 year old white female who was found in  the field by EMS unresponsive with emesis on her face.  She was brought to  the ER.  She was intubated.  She was admitted to the ICU for further  assessment and treatment.  She was found positive for cocaine, Soma,  benzodiazepines, and methadone.  Acetaminophen level and salicylate were  below detectable thresholds.  The patient responded very well to therapy and  on the second day of admission was extubated.  She was transferred to a  regular floor.  She was seen by Dr. Jeanie Sewer for depression and anxiety.   ADMISSION PHYSICAL EXAMINATION:  GENERAL:  Patient was sedated on the vent.  HEENT:  Eyes PERRL.  Extraocular movements are intact.  Vomitus on the face.  RESPIRATORY:  Coarse breath sounds bilaterally.  Wheezing.  CARDIOVASCULAR:  Regular rate and rhythm.  No murmurs, rubs or gallops.  CENTRAL NERVOUS SYSTEM:  Patient was sedated.  Deep tendon reflexes normal  and Babinski's negative.   LABS ON ADMISSION:  Hemoglobin 11, white blood cells 5.8, platelets 296.  Sodium 135, potassium 2.9, chloride 101, bicarb 25, BUN 14, creatinine 0.8,  glucose 259.  Anion gap of 9.   HOSPITAL COURSE:  Problem #1:  VDRF secondary to drug overdose with Xanax,  methadone, Soma, and cocaine.  The patient was stable on the vent for  several hours.  She was extubated the next day.  She was placed on Atrovent  and albuterol nebulizers for bronchodilation.  She was followed up with x-  ray for aspiration pneumonia, which was negative x2.   Problem #2:  Drug overdose:  The patient was evaluated by Dr. Jeanie Sewer for  depression and anxiety.  She refused hospitalization to behavioral health.  She wanted to go home.  She regretted the suicide attempt.  She actually  stated that she did not want to commit suicide, she just wanted to make a  point.   Problem #3:  Hypokalemia:  Most likely secondary to vomiting.  The patient  has potassium repleted during her hospitalization.   Problem #4:  Hyperglycemia during ICU stay:  The patient was placed on  hyperglycemia  protocol at discharge.  Blood glucose was within normal  limits.   Problem #5:  Anemia:  Ferritin at admission was 15.  Iron was 22.  Percent  saturation was 5.  She will have ferrous sulfate as an outpatient, and she  will follow up with [facility] for further treatment and evaluation.   DISCHARGE LABS:  Sodium 141, potassium 3.4, chloride 107, bicarb 27, BUN 3,  creatinine 0.7, glucose 90.  Hemoglobin 9.6, white blood count 7.7,  platelets 240.      DA/MEDQ  D:  02/13/2005  T:  02/13/2005  Job:  [id number]

## 2011-04-20 NOTE — Discharge Summary (Signed)
NAMEZULMA, COURT NO.:  [id number]   MEDICAL RECORD NO.:  [id number]          PATIENT TYPE:  IPS   LOCATION:  0307                          FACILITY:  [facility]   PHYSICIAN:  Jasmine Pang, M.D. DATE OF BIRTH:  1966/08/16   DATE OF ADMISSION:  07/19/2008  DATE OF DISCHARGE:  07/23/2008                               DISCHARGE SUMMARY   IDENTIFICATION:  This is a 45 year old white female who was admitted on  a voluntary basis on July 19, 2008.   HISTORY OF PRESENT ILLNESS:  The patient reports that she has been  depressed and said that she just could not take anymore.  She has been  having a lot of stressors including her brother being ill, a grand baby  who was due soon, and some financial issues.  She was experiencing  racing thoughts, mood swings, feeling depressed, and anxious endorsing  panic attacks.  Her sleep has been decreased.  Appetite has been  satisfactory.   PAST PSYCHIATRIC HISTORY:  This is the first admission to [facility].  No current outpatient mental health treatment.  She is  currently on methadone treatment center.   PAST MEDICATIONS:  Have included trazodone, which gave her intense  dreams.   ALCOHOL AND DRUG HISTORY:  The patient smokes.  There is no alcohol use.  She denies any substance use.   MEDICAL PROBLEMS:  Chronic pain.   MEDICATIONS:  1. Methadone 80 mg daily.  2. Zoloft 100 mg prescribed by Dr. Reche Dixon.  3. Klonopin 1 mg t.i.d. prescribed by [facility].   DRUG ALLERGIES:  PENICILLIN.   PHYSICAL FINDINGS:  There were no acute physical or medical problems  noted.  Her physical exam was done at the [facility] ED prior to  admission.   LABORATORY DATA:  Glucose was 112.  Urinalysis was negative.  Alcohol  level less than 5.  Urine drug screen was positive for opiates, positive  for benzodiazepines.   HOSPITAL COURSE:  Upon admission, the patient was started on trazodone  50 mg p.o. q.h.s. may repeat x1.  She  was also restarted on her  methadone 80 mg daily and Zoloft 100 mg daily.  She was started on  Klonopin 1 mg p.o. b.i.d. and at h.s.  On July 19, 2008, she was  started on Abilify 5 mg p.o. q.h.s. and Ambien 10 mg p.o. q.h.s. may  repeat x1 p.r.n.  In individual sessions with me, the patient was  somewhat reserved.  She was sleepy and tended to stay in bed.  She did  not want to initially participate in unit therapeutic groups and  activities.  This did improve as the hospitalization progressed.  She  was tearful with anhedonia and anergia.  She said she was having racing  thoughts with mood swings.  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  On  July 20, 2008, she was still depressed and anxious.  She was having  some auditory hallucinations hurts and talking earlier.  She is  anxious about where she is going to go when she leaves.  On July 21, 2008, sleep was good, appetite was poor secondary to constipation.  She  was given magnesium citrate to help with the constipation.  There was  some positive suicidal ideation and continued auditory hallucinations.  She was having no side effects to her medications other than possibly  the constipation due to the methadone.  Her Klonopin was decreased to  0.5 mg p.o. b.i.d. and 0.5 mg p.o. q.h.s.  She was started on MiraLax 17  g p.o. x1 for constipation.  The patient stated she has been talking  with her brother who is very supportive.  She was still feeling hopeless  and having some trouble sleeping.  As hospitalization progressed, mental  status improved.  The patient was less depressed, less anxious.  On  July 23, 2008, the patient was less depressed and less anxious.  Affect was consistent with mood.  There was no suicidal or homicidal  ideation.  Thoughts were logical and goal-directed.  Thought content, no  predominant theme.  Cognitive was grossly intact.  Judgment  good.  Insight fair.  It was felt, the patient was safe for discharge today.   DISCHARGE DIAGNOSES:  Axis I:  Major depression.  Mood disorder, not  otherwise specified. Anxiety disorder, not otherwise specified.  Axis II:  None.  Axis III:  Chronic pain.  Axis IV:  Moderate (psychosocial problems, medical problems, burden of  psychiatric illness).  Axis V:  Global assessment of functioning was 50 upon discharge and GAF  was 40 upon admission and GAF highest past year was 60-65.   DISCHARGE PLANS:  There was no specific activity level or dietary  restriction.   POSTHOSPITAL CARE PLANS:  The patient will go to the [facility] on  July 28, 2008, at 10:45 a.m. She will also go to [name] for  primary care.   DISCHARGE MEDICATIONS:  1. Abilify 5 mg p.o. q.h.s.  2. Sertraline 100 mg daily.  3. Ambien 10-20 mg p.o. q.h.s. p.r.n.  4. Doxycycline 100 mg b.i.d. through July 30, 2008, and stop.  (This      had been started for an abscess on her head and in her inguinal      region).  She is to resume methadone per clinic instructions.  5. Klonopin 0.5 mg p.o. b.i.d. and q.h.s.      Jasmine Pang, M.D.  Electronically Signed     BHS/MEDQ  D:  07/24/2008  T:  07/24/2008  Job:  [id number]

## 2011-04-20 NOTE — Discharge Summary (Signed)
[name], [name]               ACCOUNT NO.:  [id number]   MEDICAL RECORD NO.:  [id number]          PATIENT TYPE:  IPS   LOCATION:  0501                          FACILITY:  [facility]   PHYSICIAN:  Anselm Jungling, MD  DATE OF BIRTH:  1965/12/15   DATE OF ADMISSION:  09/16/2008  DATE OF DISCHARGE:  09/20/2008                               DISCHARGE SUMMARY   IDENTIFYING DATA:   REASON FOR ADMISSION:  The patient is a 45 year old divorced white  female who was admitted due to increasing suicidal ideation, depression,  and hopelessness.  She was experiencing multiple psychosocial stressors.  In addition, she was in a methadone treatment program.  She had been  receiving Zoloft for depression via a doctor at [facility], but had  been off it for 1 week.  Please refer to the admission note for further  details pertaining to the symptoms, circumstances and history that led  to her hospitalization.  She was given an initial Axis I diagnosis of  depressive disorder NOS, and opiate dependence, on methadone  maintenance.   MEDICAL AND LABORATORY:  The patient was medically and physically  assessed by the psychiatric nurse practitioner.  She was in good health  without any active or chronic medical problems.  There were no  significant medical issues.   HOSPITAL COURSE:  The patient was admitted to the adult inpatient  psychiatric service.  She presented as a well-nourished, normally-  developed adult female who was alert, fully oriented, and depressed and  tearful. But she denied suicidal intent or plan, and verbalized a strong  desire for help.   She was restarted on Zoloft for depression, and Abilify was prescribed  as well for augmentation.  She was involved in the therapeutic milieu.  She consented to family members being contacted.  She participated in  various therapeutic groups and activities and was a good participant in  the treatment program.   By the fourth hospital day, she  appeared sufficiently stabilized to  complete her treatment in the outpatient setting.  She was agreeable to  this.  Her aftercare plan was as follows.   AFTERCARE:  The patient was to follow  up with the mental health  outpatient program at [facility], with an appointment  on September 28, 2008.   DISCHARGE MEDICATIONS:  Abilify 10 mg q.h.s., Zoloft 100 mg daily, and  methadone as prescribed by methadone program.   DISCHARGE DIAGNOSES:  AXIS I: Major depressive disorder, recurrent.  Opiate dependence, on methadone maintenance.  AXIS II: Deferred.  AXIS III: No acute or chronic illnesses.  AXIS IV: Stressors severe.  AXIS V: GAF on discharge 60.      Anselm Jungling, MD  Electronically Signed     SPB/MEDQ  D:  09/23/2008  T:  09/23/2008  Job:  [id number]

## 2011-07-20 ENCOUNTER — Emergency Department (HOSPITAL_COMMUNITY): Payer: Self-pay

## 2011-07-20 ENCOUNTER — Emergency Department (HOSPITAL_COMMUNITY)
Admission: EM | Admit: 2011-07-20 | Discharge: 2011-07-20 | Disposition: A | Discharge status: Home / Self Care | Payer: Self-pay | Attending: Emergency Medicine | Admitting: Emergency Medicine

## 2011-07-20 DIAGNOSIS — R300 Dysuria: Secondary | ICD-10-CM | POA: Insufficient documentation

## 2011-07-20 DIAGNOSIS — F329 Major depressive disorder, single episode, unspecified: Secondary | ICD-10-CM | POA: Insufficient documentation

## 2011-07-20 DIAGNOSIS — F3289 Other specified depressive episodes: Secondary | ICD-10-CM | POA: Insufficient documentation

## 2011-07-20 DIAGNOSIS — R109 Unspecified abdominal pain: Secondary | ICD-10-CM | POA: Insufficient documentation

## 2011-07-20 DIAGNOSIS — K7689 Other specified diseases of liver: Secondary | ICD-10-CM | POA: Insufficient documentation

## 2011-07-20 DIAGNOSIS — Z79899 Other long term (current) drug therapy: Secondary | ICD-10-CM | POA: Insufficient documentation

## 2011-07-20 DIAGNOSIS — N3090 Cystitis, unspecified without hematuria: Secondary | ICD-10-CM | POA: Insufficient documentation

## 2011-07-20 LAB — DIFFERENTIAL
Basophils Absolute: 0 10*3/uL (ref 0.0–0.1)
Lymphocytes Relative: 25 % (ref 12–46)
Neutro Abs: 6.4 10*3/uL (ref 1.7–7.7)
Neutrophils Relative %: 67 % (ref 43–77)

## 2011-07-20 LAB — BASIC METABOLIC PANEL
CO2: 28 mEq/L (ref 19–32)
Glucose, Bld: 83 mg/dL (ref 70–99)
Potassium: 3.8 mEq/L (ref 3.5–5.1)
Sodium: 137 mEq/L (ref 135–145)

## 2011-07-20 LAB — URINALYSIS, ROUTINE W REFLEX MICROSCOPIC
Glucose, UA: NEGATIVE mg/dL
Glucose, UA: NEGATIVE mg/dL
Ketones, ur: NEGATIVE mg/dL
Nitrite: NEGATIVE
Protein, ur: 30 mg/dL — AB
Protein, ur: NEGATIVE mg/dL
pH: 5.5 (ref 5.0–8.0)

## 2011-07-20 LAB — CBC
HCT: 41 % (ref 36.0–46.0)
Hemoglobin: 13.3 g/dL (ref 12.0–15.0)
RBC: 4.99 MIL/uL (ref 3.87–5.11)
WBC: 9.6 10*3/uL (ref 4.0–10.5)

## 2011-07-20 LAB — URINE MICROSCOPIC-ADD ON

## 2011-07-20 LAB — POCT PREGNANCY, URINE: Preg Test, Ur: NEGATIVE

## 2011-07-20 LAB — PREGNANCY, URINE: Preg Test, Ur: NEGATIVE

## 2011-07-20 MED ORDER — IOHEXOL 300 MG/ML  SOLN
100.0000 mL | Freq: Once | INTRAMUSCULAR | Status: AC | PRN
  Administered 2011-07-20: 100 mL via INTRAVENOUS

## 2011-07-22 LAB — URINE CULTURE
Culture  Setup Time: 201208180039
Culture: NO GROWTH

## 2011-09-06 LAB — URINALYSIS, ROUTINE W REFLEX MICROSCOPIC
Glucose, UA: NEGATIVE mg/dL
Ketones, ur: NEGATIVE mg/dL
Nitrite: NEGATIVE
Specific Gravity, Urine: 1.017 (ref 1.005–1.030)
pH: 6.5 (ref 5.0–8.0)

## 2011-09-06 LAB — URINE MICROSCOPIC-ADD ON

## 2011-09-06 LAB — DIFFERENTIAL
Eosinophils Relative: 2 % (ref 0–5)
Lymphocytes Relative: 33 % (ref 12–46)
Lymphs Abs: 1.8 10*3/uL (ref 0.7–4.0)
Monocytes Absolute: 0.4 10*3/uL (ref 0.1–1.0)
Monocytes Relative: 7 % (ref 3–12)

## 2011-09-06 LAB — RAPID URINE DRUG SCREEN, HOSP PERFORMED
Barbiturates: NOT DETECTED
Benzodiazepines: POSITIVE — AB

## 2011-09-06 LAB — CBC
HCT: 35.6 % — ABNORMAL LOW (ref 36.0–46.0)
Hemoglobin: 11.3 g/dL — ABNORMAL LOW (ref 12.0–15.0)
WBC: 5.5 10*3/uL (ref 4.0–10.5)

## 2011-09-06 LAB — BASIC METABOLIC PANEL
GFR calc non Af Amer: >60 mL/min (ref >60)
Glucose, Bld: 88 mg/dL (ref 70–99)
Potassium: 3.9 mEq/L (ref 3.5–5.1)
Sodium: 139 mEq/L (ref 135–145)

## 2011-09-06 LAB — TRICYCLICS SCREEN, URINE: TCA Scrn: NOT DETECTED

## 2011-09-06 LAB — ETHANOL: Alcohol, Ethyl (B): <5 mg/dL (ref 0–10)

## 2011-09-07 LAB — BASIC METABOLIC PANEL
BUN: 12 mg/dL (ref 6–23)
Calcium: 8.8 mg/dL (ref 8.4–10.5)
GFR calc non Af Amer: >60 mL/min (ref >60)
Glucose, Bld: 120 mg/dL — ABNORMAL HIGH (ref 70–99)
Potassium: 3.8 mEq/L (ref 3.5–5.1)

## 2011-09-07 LAB — COMPREHENSIVE METABOLIC PANEL
BUN: 9 mg/dL (ref 6–23)
Calcium: 8.6 mg/dL (ref 8.4–10.5)
Creatinine, Ser: 0.79 mg/dL (ref 0.4–1.2)
GFR calc non Af Amer: >60 mL/min (ref >60)
Glucose, Bld: 97 mg/dL (ref 70–99)
Sodium: 143 mEq/L (ref 135–145)
Total Protein: 6.2 g/dL (ref 6.0–8.3)

## 2011-09-07 LAB — DIFFERENTIAL
Basophils Absolute: 0 10*3/uL (ref 0.0–0.1)
Basophils Absolute: 0.1 10*3/uL (ref 0.0–0.1)
Basophils Relative: 0 % (ref 0–1)
Basophils Relative: 1 % (ref 0–1)
Eosinophils Relative: 3 % (ref 0–5)
Lymphocytes Relative: 28 % (ref 12–46)
Lymphocytes Relative: 40 % (ref 12–46)
Monocytes Absolute: 0.5 10*3/uL (ref 0.1–1.0)
Monocytes Relative: 8 % (ref 3–12)
Neutro Abs: 3.2 10*3/uL (ref 1.7–7.7)
Neutrophils Relative %: 48 % (ref 43–77)

## 2011-09-07 LAB — URINE DRUGS OF ABUSE SCREEN W ALC, ROUTINE (REF LAB)
Amphetamine Screen, Ur: NEGATIVE
Cocaine Metabolites: NEGATIVE
Creatinine,U: 40.3 mg/dL
Ethyl Alcohol: <10 mg/dL (ref <10)
Opiate Screen, Urine: NEGATIVE

## 2011-09-07 LAB — CBC
HCT: 30.1 % — ABNORMAL LOW (ref 36.0–46.0)
HCT: 31.2 % — ABNORMAL LOW (ref 36.0–46.0)
HCT: 32.3 % — ABNORMAL LOW (ref 36.0–46.0)
Hemoglobin: 10.8 g/dL — ABNORMAL LOW (ref 12.0–15.0)
Hemoglobin: 9.7 g/dL — ABNORMAL LOW (ref 12.0–15.0)
MCHC: 32.2 g/dL (ref 30.0–36.0)
MCV: 81.9 fL (ref 78.0–100.0)
MCV: 82.5 fL (ref 78.0–100.0)
Platelets: 208 10*3/uL (ref 150–400)
Platelets: 231 10*3/uL (ref 150–400)
RBC: 4.06 MIL/uL (ref 3.87–5.11)
RDW: 15.1 % (ref 11.5–15.5)
RDW: 15.2 % (ref 11.5–15.5)
RDW: 15.5 % (ref 11.5–15.5)
RDW: 15.7 % — ABNORMAL HIGH (ref 11.5–15.5)

## 2011-09-07 LAB — TSH: TSH: 1.336 u[IU]/mL (ref 0.350–4.500)

## 2011-09-07 LAB — METHADONE, CONFIRMATION: Methadone GC/MS Conf: 7400 ng/mL

## 2011-09-07 LAB — LEGIONELLA ANTIGEN, URINE: Legionella Antigen, Urine: NEGATIVE

## 2011-09-07 LAB — STREP PNEUMONIAE URINARY ANTIGEN: Strep Pneumo Urinary Antigen: NEGATIVE

## 2011-11-19 ENCOUNTER — Emergency Department (HOSPITAL_COMMUNITY)
Admission: EM | Admit: 2011-11-19 | Discharge: 2011-11-19 | Disposition: A | Discharge status: Home / Self Care | Payer: Self-pay | Attending: Emergency Medicine | Admitting: Emergency Medicine

## 2011-11-19 ENCOUNTER — Encounter: Payer: Self-pay | Admitting: Emergency Medicine

## 2011-11-19 DIAGNOSIS — F17200 Nicotine dependence, unspecified, uncomplicated: Secondary | ICD-10-CM | POA: Insufficient documentation

## 2011-11-19 DIAGNOSIS — S91309A Unspecified open wound, unspecified foot, initial encounter: Secondary | ICD-10-CM | POA: Insufficient documentation

## 2011-11-19 DIAGNOSIS — W268XXA Contact with other sharp object(s), not elsewhere classified, initial encounter: Secondary | ICD-10-CM | POA: Insufficient documentation

## 2011-11-19 DIAGNOSIS — M7989 Other specified soft tissue disorders: Secondary | ICD-10-CM | POA: Insufficient documentation

## 2011-11-19 DIAGNOSIS — M79609 Pain in unspecified limb: Secondary | ICD-10-CM | POA: Insufficient documentation

## 2011-11-19 DIAGNOSIS — S91339A Puncture wound without foreign body, unspecified foot, initial encounter: Secondary | ICD-10-CM

## 2011-11-19 DIAGNOSIS — L0390 Cellulitis, unspecified: Secondary | ICD-10-CM

## 2011-11-19 MED ORDER — TETANUS-DIPHTH-ACELL PERTUSSIS 5-2.5-18.5 LF-MCG/0.5 IM SUSP
0.5000 mL | Freq: Once | INTRAMUSCULAR | Status: AC
  Administered 2011-11-19: 0.5 mL via INTRAMUSCULAR
  Filled 2011-11-19: qty 0.5

## 2011-11-19 MED ORDER — CEPHALEXIN 500 MG PO CAPS: 500.0000 mg | ORAL_CAPSULE | Freq: Four times a day (QID) | ORAL | Status: AC

## 2011-11-19 MED ORDER — OXYCODONE-ACETAMINOPHEN 5-325 MG PO TABS
1.0000 | ORAL_TABLET | Freq: Once | ORAL | Status: AC
  Administered 2011-11-19: 1 via ORAL
  Filled 2011-11-19: qty 1

## 2011-11-19 MED ORDER — HYDROCODONE-ACETAMINOPHEN 5-325 MG PO TABS: 1.0000 | ORAL_TABLET | ORAL | Status: AC | PRN

## 2011-11-19 NOTE — ED Notes (Signed)
Pt unsure when last tetanus shot

## 2011-11-19 NOTE — ED Notes (Signed)
Pt requesting tetanus shot. States stepped on rusty nail yesterday and it has been a while since her tetanus.

## 2011-11-19 NOTE — ED Provider Notes (Signed)
History     CSN: [id number] Arrival date & time: 11/19/2011  5:28 PM   First MD Initiated Contact with Patient 11/19/11 1831      Chief Complaint  Patient presents with  . Immunizations     Patient is a 45 y.o. female presenting with lower extremity pain.  Foot Pain This is a new problem. The current episode started yesterday. The problem occurs constantly. The problem has been gradually worsening. The symptoms are aggravated by walking. She has tried nothing for the symptoms. The treatment provided no relief.  Foot Pain This is a new problem. The current episode started yesterday. The problem occurs constantly. The problem has been gradually worsening. The symptoms are aggravated by walking. She has tried nothing for the symptoms. The treatment provided no relief.  Reports he stepped on a rusty nail yesterday that she believes went almost all the way through the sole of  her (R) foot. She now has corresponding swelling, redness and pain to the dorsal aspect of the (R) foot. States needs a Tetanus shot as well. History reviewed. No pertinent past medical history.  Past Surgical History  Procedure Date  . Abdominal hysterectomy     History reviewed. No pertinent family history.  History  Substance Use Topics  . Smoking status: Current Everyday Smoker -- 0.5 packs/day    Types: Cigarettes  . Smokeless tobacco: Not on file  . Alcohol Use: No    OB History    Grav Para Term Preterm Abortions TAB SAB Ect Mult Living                  Review of Systems  Constitutional: Negative.   HENT: Negative.   Eyes: Negative.   Respiratory: Negative.   Cardiovascular: Negative.   Gastrointestinal: Negative.   Genitourinary: Negative.   Musculoskeletal: Negative.   Skin: Negative.   Neurological: Negative.   Hematological: Negative.   Psychiatric/Behavioral: Negative.     Allergies  Compazine and Penicillins  Home Medications   Current Outpatient Rx  Name Route Sig  Dispense Refill  . IBUPROFEN 200 MG PO TABS Oral Take 600 mg by mouth every 6 (six) hours as needed. For pain.     Marland Kitchen SERTRALINE HCL 100 MG PO TABS Oral Take 100 mg by mouth 2 (two) times daily.        BP 102/72  Pulse 90  Temp(Src) 97.6 F (36.4 C) (Oral)  Resp 16  SpO2 97%  Physical Exam  Constitutional: She is oriented to person, place, and time. She appears well-developed and well-nourished.  HENT:  Head: Normocephalic and atraumatic.  Eyes: Conjunctivae are normal.  Neck: Neck supple.  Cardiovascular: Normal rate and regular rhythm.   Pulmonary/Chest: Effort normal and breath sounds normal.  Abdominal: Soft. Bowel sounds are normal.  Musculoskeletal: Normal range of motion.       Feet:       Small puncture wound to plantar aspect of (R) foot just beneath the great and 2nd toe.  Neurological: She is alert and oriented to person, place, and time.  Skin: Skin is warm and dry. No erythema.  Psychiatric: She has a normal mood and affect.    ED Course  Procedures Impression and plan discussed. Pt agreeable.    1. Cellulitis   2. Puncture wound to foot       MDM  Wound infection to (R) foot s/p puncture wound.    Medical screening examination/treatment/procedure(s) were performed by non-physician practitioner and as supervising physician I  was immediately available for consultation/collaboration. Osvaldo Human, M.D.     Leanne Chang, NP 11/19/11 1929  Carleene Cooper III, MD 11/20/11 [phone]

## 2012-02-21 ENCOUNTER — Encounter (HOSPITAL_COMMUNITY): Payer: Self-pay | Admitting: Emergency Medicine

## 2012-02-21 ENCOUNTER — Emergency Department (HOSPITAL_COMMUNITY)
Admission: EM | Admit: 2012-02-21 | Discharge: 2012-02-21 | Disposition: A | Discharge status: Home / Self Care | Payer: Self-pay | Attending: Emergency Medicine | Admitting: Emergency Medicine

## 2012-02-21 ENCOUNTER — Emergency Department (HOSPITAL_COMMUNITY): Payer: Self-pay

## 2012-02-21 DIAGNOSIS — L0291 Cutaneous abscess, unspecified: Secondary | ICD-10-CM

## 2012-02-21 DIAGNOSIS — R062 Wheezing: Secondary | ICD-10-CM | POA: Insufficient documentation

## 2012-02-21 DIAGNOSIS — R05 Cough: Secondary | ICD-10-CM | POA: Insufficient documentation

## 2012-02-21 DIAGNOSIS — F17200 Nicotine dependence, unspecified, uncomplicated: Secondary | ICD-10-CM | POA: Insufficient documentation

## 2012-02-21 DIAGNOSIS — R61 Generalized hyperhidrosis: Secondary | ICD-10-CM | POA: Insufficient documentation

## 2012-02-21 DIAGNOSIS — N61 Mastitis without abscess: Secondary | ICD-10-CM | POA: Insufficient documentation

## 2012-02-21 DIAGNOSIS — J3489 Other specified disorders of nose and nasal sinuses: Secondary | ICD-10-CM | POA: Insufficient documentation

## 2012-02-21 DIAGNOSIS — J40 Bronchitis, not specified as acute or chronic: Secondary | ICD-10-CM | POA: Insufficient documentation

## 2012-02-21 DIAGNOSIS — F319 Bipolar disorder, unspecified: Secondary | ICD-10-CM | POA: Insufficient documentation

## 2012-02-21 DIAGNOSIS — R6883 Chills (without fever): Secondary | ICD-10-CM | POA: Insufficient documentation

## 2012-02-21 DIAGNOSIS — Z720 Tobacco use: Secondary | ICD-10-CM

## 2012-02-21 DIAGNOSIS — R059 Cough, unspecified: Secondary | ICD-10-CM | POA: Insufficient documentation

## 2012-02-21 HISTORY — DX: Bipolar II disorder: F31.81

## 2012-02-21 HISTORY — DX: Other psychoactive substance abuse, uncomplicated: F19.10

## 2012-02-21 HISTORY — DX: Depression, unspecified: F32.A

## 2012-02-21 HISTORY — DX: Major depressive disorder, single episode, unspecified: F32.9

## 2012-02-21 MED ORDER — IPRATROPIUM BROMIDE 0.02 % IN SOLN
0.5000 mg | Freq: Once | RESPIRATORY_TRACT | Status: AC
  Administered 2012-02-21: 0.5 mg via RESPIRATORY_TRACT
  Filled 2012-02-21: qty 2.5

## 2012-02-21 MED ORDER — PREDNISONE 20 MG PO TABS
60.0000 mg | ORAL_TABLET | Freq: Once | ORAL | Status: AC
  Administered 2012-02-21: 60 mg via ORAL
  Filled 2012-02-21: qty 3

## 2012-02-21 MED ORDER — HYDROCOD POLST-CHLORPHEN POLST 10-8 MG/5ML PO LQCR
5.0000 mL | Freq: Once | ORAL | Status: AC
  Administered 2012-02-21: 5 mL via ORAL
  Filled 2012-02-21: qty 5

## 2012-02-21 MED ORDER — HYDROCODONE-ACETAMINOPHEN 7.5-500 MG/15ML PO SOLN: 10.0000 mL | Freq: Four times a day (QID) | ORAL | Status: AC | PRN

## 2012-02-21 MED ORDER — ALBUTEROL SULFATE HFA 108 (90 BASE) MCG/ACT IN AERS
2.0000 | INHALATION_SPRAY | Freq: Once | RESPIRATORY_TRACT | Status: AC
  Administered 2012-02-21: 2 via RESPIRATORY_TRACT
  Filled 2012-02-21: qty 6.7

## 2012-02-21 MED ORDER — SULFAMETHOXAZOLE-TRIMETHOPRIM 800-160 MG PO TABS: 1.0000 | ORAL_TABLET | Freq: Two times a day (BID) | ORAL | Status: AC

## 2012-02-21 MED ORDER — PREDNISONE 20 MG PO TABS: 60.0000 mg | ORAL_TABLET | Freq: Once | ORAL | Status: AC

## 2012-02-21 MED ORDER — ALBUTEROL SULFATE (5 MG/ML) 0.5% IN NEBU
5.0000 mg | INHALATION_SOLUTION | Freq: Once | RESPIRATORY_TRACT | Status: AC
  Administered 2012-02-21: 5 mg via RESPIRATORY_TRACT
  Filled 2012-02-21: qty 1

## 2012-02-21 NOTE — Discharge Instructions (Signed)
FOLLOW UP WITH YOUR DOCTOR FOR RECHECK OR RETURN HERE WITH ANY WORSENING SYMPTOMS - SHORTNESS OF BREATH, HIGH FEVER. USE MEDICATIONS AS PRESCRIBED. WARM COMPRESSES TO THE ABSCESSES OF LEFT BREAST AND RETURN FOR RECHECK IF THESE BECOME MORE PAINFUL OR IF THEY START DRAINING.  Abscess An abscess (boil or furuncle) is an infected area that contains a collection of pus.  SYMPTOMS Signs and symptoms of an abscess include pain, tenderness, redness, or hardness. You may feel a moveable soft area under your skin. An abscess can occur anywhere in the body.  TREATMENT  A surgical cut (incision) may be made over your abscess to drain the pus. Gauze may be packed into the space or a drain may be looped through the abscess cavity (pocket). This provides a drain that will allow the cavity to heal from the inside outwards. The abscess may be painful for a few days, but should feel much better if it was drained.  Your abscess, if seen early, may not have localized and may not have been drained. If not, another appointment may be required if it does not get better on its own or with medications. HOME CARE INSTRUCTIONS   Only take over-the-counter or prescription medicines for pain, discomfort, or fever as directed by your caregiver.   Take your antibiotics as directed if they were prescribed. Finish them even if you start to feel better.   Keep the skin and clothes clean around your abscess.   If the abscess was drained, you will need to use gauze dressing to collect any draining pus. Dressings will typically need to be changed 3 or more times a day.   The infection may spread by skin contact with others. Avoid skin contact as much as possible.   Practice good hygiene. This includes regular hand washing, cover any draining skin lesions, and do not share personal care items.   If you participate in sports, do not share athletic equipment, towels, whirlpools, or personal care items. Shower after every practice  or tournament.   If a draining area cannot be adequately covered:   Do not participate in sports.   Children should not participate in day care until the wound has healed or drainage stops.   If your caregiver has given you a follow-up appointment, it is very important to keep that appointment. Not keeping the appointment could result in a much worse infection, chronic or permanent injury, pain, and disability. If there is any problem keeping the appointment, you must call back to this facility for assistance.  SEEK MEDICAL CARE IF:   You develop increased pain, swelling, redness, drainage, or bleeding in the wound site.   You develop signs of generalized infection including muscle aches, chills, fever, or a general ill feeling.   You have an oral temperature above 102 F (38.9 C).  MAKE SURE YOU:   Understand these instructions.   Will watch your condition.   Will get help right away if you are not doing well or get worse.  Document Released: 08/29/2005 Document Revised: 11/08/2011 Document Reviewed: 06/22/2008 [facility] Patient Information 2012 [place], [place].  Bronchitis Bronchitis is a problem of the air tubes leading to your lungs. This problem makes it hard for air to get in and out of the lungs. You may cough a lot because your air tubes are narrow. Going without care can cause lasting (chronic) bronchitis. HOME CARE   Drink enough fluids to keep your pee (urine) clear or pale yellow.   Use a  cool mist humidifier.   Quit smoking if you smoke. If you keep smoking, the bronchitis might not get better.   Only take medicine as told by your doctor.  GET HELP RIGHT AWAY IF:   Coughing keeps you awake.   You start to wheeze.   You become more sick or weak.   You have a hard time breathing or get short of breath.   You cough up blood.   Coughing lasts more than 2 weeks.   You have a fever.   Your baby is older than 3 months with a rectal temperature of 102 F  (38.9 C) or higher.   Your baby is 6 months old or younger with a rectal temperature of 100.4 F (38 C) or higher.  MAKE SURE YOU:  Understand these instructions.   Will watch your condition.   Will get help right away if you are not doing well or get worse.  Document Released: 05/07/2008 Document Revised: 11/08/2011 Document Reviewed: 10/21/2009 [facility] Patient Information 2012 [place], [place].

## 2012-02-21 NOTE — ED Provider Notes (Signed)
History     CSN: [id number]  Arrival date & time 02/21/12  4098   First MD Initiated Contact with Patient 02/21/12 [phone]      Chief Complaint  Patient presents with  . Cough    (Consider location/radiation/quality/duration/timing/severity/associated sxs/prior treatment) Patient is a 46 y.o. female presenting with cough and abscess. The history is provided by the patient.  Cough This is a new problem. The current episode started more than 2 days ago. The problem occurs constantly. The problem has been gradually worsening. The maximum temperature recorded prior to her arrival was 103 to 104 F. The fever has been present for 1 to 2 days. Associated symptoms include chills, sweats, rhinorrhea and wheezing. Her past medical history is significant for pneumonia.  Abscess  This is a recurrent problem. The current episode started more than one week ago. Progression since onset: History of several cutaneous abscesses. Complains of 2 on left breast x 1 week. Associated symptoms include rhinorrhea and cough. Pertinent negatives include no fever.    Past Medical History  Diagnosis Date  . Depression   . Bipolar 2 disorder   . Drug abuse     Past Surgical History  Procedure Date  . Abdominal hysterectomy     No family history on file.  History  Substance Use Topics  . Smoking status: Current Everyday Smoker -- 0.5 packs/day    Types: Cigarettes  . Smokeless tobacco: Not on file  . Alcohol Use: No    OB History    Grav Para Term Preterm Abortions TAB SAB Ect Mult Living                  Review of Systems  Constitutional: Positive for chills. Negative for fever.  HENT: Positive for rhinorrhea.   Respiratory: Positive for cough, chest tightness and wheezing.   Cardiovascular: Negative.   Gastrointestinal: Negative.   Musculoskeletal: Negative.   Skin: Negative.        C/O skin abscesses left breast.  Neurological: Negative.     Allergies  Compazine and  Penicillins  Home Medications   Current Outpatient Rx  Name Route Sig Dispense Refill  . ALPRAZOLAM 0.5 MG PO TABS Oral Take 0.5 mg by mouth 4 (four) times daily as needed. For anxiety    . METHADONE HCL 10 MG/ML PO CONC Oral Take 130 mg by mouth daily. Given at [facility] on [address]    . QUETIAPINE FUMARATE 300 MG PO TABS Oral Take 300 mg by mouth at bedtime.    . SERTRALINE HCL 100 MG PO TABS Oral Take 100 mg by mouth 2 (two) times daily.        BP 121/69  Pulse 89  Temp 98.6 F (37 C)  Resp 16  SpO2 96%  Physical Exam  Constitutional: She appears well-developed and well-nourished.  HENT:  Head: Normocephalic.  Neck: Normal range of motion. Neck supple.  Cardiovascular: Normal rate and regular rhythm.   Pulmonary/Chest: Tachypnea noted. She has wheezes.       Patient speaks in broken sentences.  Abdominal: Soft. Bowel sounds are normal. There is no tenderness. There is no rebound and no guarding.  Musculoskeletal: Normal range of motion. She exhibits no edema.  Neurological: She is alert. No cranial nerve deficit.  Skin: Skin is warm and dry. No rash noted.       Two red, mildly tender areas to left breast without induration or active drainage.   Psychiatric: She has a normal mood and affect.  ED Course  Procedures (including critical care time)  Labs Reviewed - No data to display No results found.   No diagnosis found.    MDM  Feeling better. No continued wheezing. Will treat for bronchitis, encourage follow up with PCP.        Rodena Medin, PA-C 02/21/12 1028

## 2012-02-21 NOTE — ED Notes (Signed)
Cough x 4 days states had to sleep sitting up she was coughing so hard

## 2012-02-21 NOTE — ED Provider Notes (Signed)
Medical screening examination/treatment/procedure(s) were performed by non-physician practitioner and as supervising physician I was immediately available for consultation/collaboration.   Benny Lennert, MD 02/21/12 1137

## 2012-05-13 ENCOUNTER — Emergency Department (HOSPITAL_COMMUNITY)
Admission: EM | Admit: 2012-05-13 | Discharge: 2012-05-14 | Disposition: A | Discharge status: Other Acute Inpatient Hospital | Payer: Self-pay

## 2012-05-13 ENCOUNTER — Encounter (HOSPITAL_COMMUNITY): Payer: Self-pay | Admitting: *Deleted

## 2012-05-13 DIAGNOSIS — F17200 Nicotine dependence, unspecified, uncomplicated: Secondary | ICD-10-CM | POA: Insufficient documentation

## 2012-05-13 DIAGNOSIS — F909 Attention-deficit hyperactivity disorder, unspecified type: Secondary | ICD-10-CM

## 2012-05-13 DIAGNOSIS — Z79899 Other long term (current) drug therapy: Secondary | ICD-10-CM | POA: Insufficient documentation

## 2012-05-13 DIAGNOSIS — F3189 Other bipolar disorder: Secondary | ICD-10-CM | POA: Insufficient documentation

## 2012-05-13 DIAGNOSIS — F1120 Opioid dependence, uncomplicated: Secondary | ICD-10-CM

## 2012-05-13 DIAGNOSIS — F411 Generalized anxiety disorder: Secondary | ICD-10-CM | POA: Insufficient documentation

## 2012-05-13 DIAGNOSIS — Z9119 Patient's noncompliance with other medical treatment and regimen: Secondary | ICD-10-CM | POA: Insufficient documentation

## 2012-05-13 DIAGNOSIS — Z91199 Patient's noncompliance with other medical treatment and regimen due to unspecified reason: Secondary | ICD-10-CM | POA: Insufficient documentation

## 2012-05-13 DIAGNOSIS — F3130 Bipolar disorder, current episode depressed, mild or moderate severity, unspecified: Secondary | ICD-10-CM

## 2012-05-13 DIAGNOSIS — F419 Anxiety disorder, unspecified: Secondary | ICD-10-CM

## 2012-05-13 DIAGNOSIS — Z9114 Patient's other noncompliance with medication regimen: Secondary | ICD-10-CM

## 2012-05-13 LAB — DIFFERENTIAL
Basophils Absolute: 0 10*3/uL (ref 0.0–0.1)
Basophils Relative: 0 % (ref 0–1)
Eosinophils Absolute: 0.1 10*3/uL (ref 0.0–0.7)
Neutro Abs: 6.7 10*3/uL (ref 1.7–7.7)
Neutrophils Relative %: 78 % — ABNORMAL HIGH (ref 43–77)

## 2012-05-13 LAB — COMPREHENSIVE METABOLIC PANEL
ALT: 36 U/L — ABNORMAL HIGH (ref 0–35)
AST: 29 U/L (ref 0–37)
Albumin: 4.4 g/dL (ref 3.5–5.2)
Alkaline Phosphatase: 117 U/L (ref 39–117)
BUN: 8 mg/dL (ref 6–23)
Chloride: 96 mEq/L (ref 96–112)
Potassium: 3.7 mEq/L (ref 3.5–5.1)
Sodium: 138 mEq/L (ref 135–145)
Total Protein: 8.5 g/dL — ABNORMAL HIGH (ref 6.0–8.3)

## 2012-05-13 LAB — URINALYSIS, ROUTINE W REFLEX MICROSCOPIC
Glucose, UA: NEGATIVE mg/dL
Ketones, ur: NEGATIVE mg/dL
Protein, ur: NEGATIVE mg/dL
Specific Gravity, Urine: 1.02 (ref 1.005–1.030)
Urobilinogen, UA: 1 mg/dL (ref 0.0–1.0)
pH: 8 (ref 5.0–8.0)

## 2012-05-13 LAB — RAPID URINE DRUG SCREEN, HOSP PERFORMED
Amphetamines: POSITIVE — AB
Benzodiazepines: POSITIVE — AB
Cocaine: NOT DETECTED

## 2012-05-13 LAB — ETHANOL: Alcohol, Ethyl (B): <11 mg/dL (ref 0–11)

## 2012-05-13 LAB — URINE MICROSCOPIC-ADD ON

## 2012-05-13 LAB — CBC
MCH: 27.5 pg (ref 26.0–34.0)
MCHC: 33.3 g/dL (ref 30.0–36.0)
Platelets: 248 10*3/uL (ref 150–400)
RDW: 14.9 % (ref 11.5–15.5)

## 2012-05-13 LAB — PREGNANCY, URINE: Preg Test, Ur: NEGATIVE

## 2012-05-13 MED ORDER — LORAZEPAM 1 MG PO TABS
1.0000 mg | ORAL_TABLET | Freq: Three times a day (TID) | ORAL | Status: DC | PRN
  Administered 2012-05-13 – 2012-05-14 (×3): 1 mg via ORAL
  Filled 2012-05-13 (×3): qty 1

## 2012-05-13 MED ORDER — NICOTINE 21 MG/24HR TD PT24
21.0000 mg | MEDICATED_PATCH | Freq: Every day | TRANSDERMAL | Status: DC | PRN
  Administered 2012-05-13 – 2012-05-14 (×2): 21 mg via TRANSDERMAL
  Filled 2012-05-13: qty 1

## 2012-05-13 MED ORDER — ONDANSETRON HCL 4 MG PO TABS: 4.0000 mg | ORAL_TABLET | Freq: Three times a day (TID) | ORAL | Status: DC | PRN

## 2012-05-13 MED ORDER — IBUPROFEN 600 MG PO TABS
600.0000 mg | ORAL_TABLET | Freq: Three times a day (TID) | ORAL | Status: DC | PRN
  Administered 2012-05-13: 600 mg via ORAL
  Filled 2012-05-13: qty 1

## 2012-05-13 MED ORDER — QUETIAPINE FUMARATE 300 MG PO TABS
300.0000 mg | ORAL_TABLET | Freq: Every day | ORAL | Status: DC
  Administered 2012-05-13: 300 mg via ORAL
  Filled 2012-05-13 (×2): qty 1

## 2012-05-13 MED ORDER — ZOLPIDEM TARTRATE 5 MG PO TABS
5.0000 mg | ORAL_TABLET | Freq: Every evening | ORAL | Status: DC | PRN
  Administered 2012-05-13: 5 mg via ORAL
  Filled 2012-05-13: qty 1

## 2012-05-13 MED ORDER — ACETAMINOPHEN 325 MG PO TABS
650.0000 mg | ORAL_TABLET | ORAL | Status: DC | PRN
  Administered 2012-05-14: 650 mg via ORAL
  Filled 2012-05-13: qty 2

## 2012-05-13 MED ORDER — SERTRALINE HCL 50 MG PO TABS
100.0000 mg | ORAL_TABLET | Freq: Every day | ORAL | Status: DC
  Administered 2012-05-13 – 2012-05-14 (×2): 100 mg via ORAL
  Filled 2012-05-13 (×2): qty 2

## 2012-05-13 MED ORDER — AMPHETAMINE-DEXTROAMPHETAMINE 10 MG PO TABS
30.0000 mg | ORAL_TABLET | Freq: Every day | ORAL | Status: DC
  Administered 2012-05-13 – 2012-05-14 (×2): 30 mg via ORAL
  Filled 2012-05-13 (×2): qty 3

## 2012-05-13 NOTE — ED Notes (Signed)
Pt. Denies S/H/I, states that while her boyfriend was away from home she became very anxious r/t not having her daily home medicaions for 2 weeks and not having methadone since Sunday, felt a little SI then but when EMS arrive and she was not alone, she didn't feel S/I anymore. Denies A/V/H.  Pt. States that she doesn't have a car anymore and unable to get medicaions.  Pt. States that she hasn't slept in 3-4 nights or had much to eat since Sat.  Pt. Given drink/crackers.  Pt. Requesting shower.

## 2012-05-13 NOTE — ED Notes (Signed)
Per ems: pt called ems pt is out of bipolar and adhd. Pt has si thoughts . [facility]

## 2012-05-13 NOTE — ED Provider Notes (Signed)
History     CSN: [id number]  Arrival date & time 05/13/12  1114   First MD Initiated Contact with Patient 05/13/12 1132      Chief Complaint  Patient presents with  . Medical Clearance    (Consider location/radiation/quality/duration/timing/severity/associated sxs/prior treatment) HPI Comments: [name] is a 46 y.o. Female who stopped taking her medications about 10 days ago, when she ran out of them. She states that she did not have a ride to get to her doctor so could not get the prescriptions refilled. She typically doses with methadone daily, but has not had any in 2 days. She feels that even when she was taking her medicines that they were not working. The patient did not express suicidal ideation to me. She denies recent medical illnesses, including wheezing, shortness of breath, cough, nausea, vomiting, back pain, or weakness. She does not know of any causative or palliative factors.  The history is provided by the patient.    Past Medical History  Diagnosis Date  . Depression   . Bipolar 2 disorder   . Drug abuse     Past Surgical History  Procedure Date  . Abdominal hysterectomy     No family history on file.  History  Substance Use Topics  . Smoking status: Current Everyday Smoker -- 0.5 packs/day    Types: Cigarettes  . Smokeless tobacco: Not on file  . Alcohol Use: No    OB History    Grav Para Term Preterm Abortions TAB SAB Ect Mult Living                  Review of Systems  All other systems reviewed and are negative.    Allergies  Compazine and Penicillins  Home Medications   Current Outpatient Rx  Name Route Sig Dispense Refill  . ALPRAZOLAM 0.25 MG PO TABS Oral Take 0.25 mg by mouth 4 (four) times daily.    . AMPHETAMINE-DEXTROAMPHETAMINE 30 MG PO TABS Oral Take 30 mg by mouth daily.    . MUSCLE RUB 10-15 % EX CREA Topical Apply 1 application topically as needed. Apply to legs for leg pain    . METHADONE HCL 10 MG/ML PO CONC Oral  Take 130 mg by mouth daily. Given at [facility] on [address]    . QUETIAPINE FUMARATE 300 MG PO TABS Oral Take 300 mg by mouth at bedtime.    . SERTRALINE HCL 100 MG PO TABS Oral Take 100 mg by mouth daily.       BP 136/85  Pulse 87  Temp(Src) 98.4 F (36.9 C) (Oral)  Resp 18  SpO2 100%  Physical Exam  Nursing note and vitals reviewed. Constitutional: She is oriented to person, place, and time. She appears well-developed and well-nourished.  HENT:  Head: Normocephalic and atraumatic.  Eyes: Conjunctivae and EOM are normal. Pupils are equal, round, and reactive to light.  Neck: Normal range of motion and phonation normal. Neck supple.  Cardiovascular: Normal rate, regular rhythm and intact distal pulses.   Pulmonary/Chest: Effort normal and breath sounds normal. She exhibits no tenderness.  Abdominal: Soft. She exhibits no distension. There is no tenderness. There is no guarding.  Musculoskeletal: Normal range of motion.  Neurological: She is alert and oriented to person, place, and time. She has normal strength. She exhibits normal muscle tone.  Skin: Skin is warm and dry.  Psychiatric: Her behavior is normal.       Anxious, tearful at times    ED  Course  Procedures (including critical care time)  13:30- ACT consult  Labs Reviewed  CBC - Abnormal; Notable for the following:    RBC 5.39 (*)    All other components within normal limits  DIFFERENTIAL - Abnormal; Notable for the following:    Neutrophils Relative 78 (*)    All other components within normal limits  COMPREHENSIVE METABOLIC PANEL - Abnormal; Notable for the following:    Glucose, Bld 114 (*)    Total Protein 8.5 (*)    ALT 36 (*)    Total Bilirubin 0.2 (*)    All other components within normal limits  URINALYSIS, ROUTINE W REFLEX MICROSCOPIC - Abnormal; Notable for the following:    APPearance CLOUDY (*)    Leukocytes, UA LARGE (*)    All other components within normal limits  URINE MICROSCOPIC-ADD ON  - Abnormal; Notable for the following:    Squamous Epithelial / LPF FEW (*)    Bacteria, UA FEW (*)    All other components within normal limits  PREGNANCY, URINE  ETHANOL   No results found.   1. Noncompliance with medication regimen   2. Anxiety       MDM  Depression with possible suicidal ideation. Patient has not been compliant with her medication treatment.        Flint Melter, MD 05/13/12 [phone]

## 2012-05-13 NOTE — Consult Note (Signed)
Reason for Consult: Depression and passive suicidal ideation Referring Physician: Dr. Rosealee Albee is an 46 y.o. female.  HPI: Patient was seen and chart reviewed. This is a 79 years old is female, who lives with her boyfriend of 10 years brought in by emergency medical services from home for the complaints of depression, suicidal ideation, and unable to fill her prescription because of lack of money, and transportation. [facility] reportedly [facility] and her last methadone taken 2 days ago and she was treated at the [facility]. Mental health/[facility] for bipolar and ADHD Treatment. Patient was seen Dr. Ladona Ridgel about 3 weeks ago and has a prescription to refill, but has no money and no transportation to the pharmacy. Patient stated her family members and friends helped in the past, but no money, nobody is helping her at this time. Patient also stated her medication Zoloft was not working for depression, even though compliant with it. Patient feels he was Not safe at home, feels is going to hurt herself and requesting to be stabilized in the hospital. Patient has no suicidal intentions or plans. Patient has no homicidal ideations, intentions, or plans. Patient denied current drug of abuse and alcoholism. Patient denied psychosis, delusions, or paranoia.  Past Medical History  Diagnosis Date  . Depression   . Bipolar 2 disorder   . Drug abuse     Past Surgical History  Procedure Date  . Abdominal hysterectomy     No family history on file.  Social History:  reports that she has been smoking Cigarettes.  She has been smoking about .5 packs per day. She does not have any smokeless tobacco history on file. She reports that she does not drink alcohol. Her drug history not on file.  Allergies:  Allergies  Allergen Reactions  . Compazine Other (See Comments)    Makes her "panicky"  . Penicillins Rash    Medications: I have reviewed the  patient's current medications.  Results for orders placed during the hospital encounter of 05/13/12 (from the past 48 hour(s))  CBC     Status: Abnormal   Collection Time   05/13/12 11:30 AM      Component Value Range Comment   WBC 8.6  4.0 - 10.5 (K/uL)    RBC 5.39 (*) 3.87 - 5.11 (MIL/uL)    Hemoglobin 14.8  12.0 - 15.0 (g/dL)    HCT 33.8  25.0 - 53.9 (%)    MCV 82.6  78.0 - 100.0 (fL)    MCH 27.5  26.0 - 34.0 (pg)    MCHC 33.3  30.0 - 36.0 (g/dL)    RDW 76.7  34.1 - 93.7 (%)    Platelets 248  150 - 400 (K/uL)   DIFFERENTIAL     Status: Abnormal   Collection Time   05/13/12 11:30 AM      Component Value Range Comment   Neutrophils Relative 78 (*) 43 - 77 (%)    Neutro Abs 6.7  1.7 - 7.7 (K/uL)    Lymphocytes Relative 17  12 - 46 (%)    Lymphs Abs 1.4  0.7 - 4.0 (K/uL)    Monocytes Relative 5  3 - 12 (%)    Monocytes Absolute 0.4  0.1 - 1.0 (K/uL)    Eosinophils Relative 1  0 - 5 (%)    Eosinophils Absolute 0.1  0.0 - 0.7 (K/uL)    Basophils Relative 0  0 - 1 (%)  Basophils Absolute 0.0  0.0 - 0.1 (K/uL)   COMPREHENSIVE METABOLIC PANEL     Status: Abnormal   Collection Time   05/13/12 11:30 AM      Component Value Range Comment   Sodium 138  135 - 145 (mEq/L)    Potassium 3.7  3.5 - 5.1 (mEq/L)    Chloride 96  96 - 112 (mEq/L)    CO2 29  19 - 32 (mEq/L)    Glucose, Bld 114 (*) 70 - 99 (mg/dL)    BUN 8  6 - 23 (mg/dL)    Creatinine, Ser 1.91  0.50 - 1.10 (mg/dL)    Calcium 47.8  8.4 - 10.5 (mg/dL)    Total Protein 8.5 (*) 6.0 - 8.3 (g/dL)    Albumin 4.4  3.5 - 5.2 (g/dL)    AST 29  0 - 37 (U/L)    ALT 36 (*) 0 - 35 (U/L)    Alkaline Phosphatase 117  39 - 117 (U/L)    Total Bilirubin 0.2 (*) 0.3 - 1.2 (mg/dL)    GFR calc non Af Amer >90  >90 (mL/min)    GFR calc Af Amer >90  >90 (mL/min)   ETHANOL     Status: Normal   Collection Time   05/13/12 11:30 AM      Component Value Range Comment   Alcohol, Ethyl (B) <11  0 - 11 (mg/dL)   PREGNANCY, URINE     Status: Normal    Collection Time   05/13/12 12:37 PM      Component Value Range Comment   Preg Test, Ur NEGATIVE  NEGATIVE    URINALYSIS, ROUTINE W REFLEX MICROSCOPIC     Status: Abnormal   Collection Time   05/13/12 12:37 PM      Component Value Range Comment   Color, Urine YELLOW  YELLOW     APPearance CLOUDY (*) CLEAR     Specific Gravity, Urine 1.020  1.005 - 1.030     pH 8.0  5.0 - 8.0     Glucose, UA NEGATIVE  NEGATIVE (mg/dL)    Hgb urine dipstick NEGATIVE  NEGATIVE     Bilirubin Urine NEGATIVE  NEGATIVE     Ketones, ur NEGATIVE  NEGATIVE (mg/dL)    Protein, ur NEGATIVE  NEGATIVE (mg/dL)    Urobilinogen, UA 1.0  0.0 - 1.0 (mg/dL)    Nitrite NEGATIVE  NEGATIVE     Leukocytes, UA LARGE (*) NEGATIVE    URINE MICROSCOPIC-ADD ON     Status: Abnormal   Collection Time   05/13/12 12:37 PM      Component Value Range Comment   Squamous Epithelial / LPF FEW (*) RARE     WBC, UA 21-50  <3 (WBC/hpf)    Bacteria, UA FEW (*) RARE     Urine-Other MUCOUS PRESENT       No results found.  No psychosis and Positive for anxiety, bad mood, bipolar, depression, mood swings and sleep disturbance Blood pressure 136/85, pulse 87, temperature 98.4 F (36.9 C), temperature source Oral, resp. rate 18, SpO2 100.00%.   Assessment/Plan: Bipolar disorder, who, most recent episode depression Attention deficit hyperactivity disorder Opiate dependence, and methadone maintenance.  Recommended admission to the psychiatric hospitalization for safety and stabilization.   Deanna Wiater,JANARDHAHA R. 05/13/2012, 5:20 PM

## 2012-05-13 NOTE — ED Notes (Signed)
Pt's boyfriend is [place], [phone], pt. Gives her consent to call him if needed.

## 2012-05-13 NOTE — BH Assessment (Signed)
Assessment Note   [name] is an 46 y.o. female. Pt reported to the [facility] for medical clearance. Pt reports that she has a previous mental health diagnosis of Bipolar Disorder and ADHD. Pt's outpatient psychiatrist is Dr. Ladona Ridgel at [facility] and she goes to [facility] for methadone treatment. Pt denies any substance use and states that she gets methadone for pain management. Pt states that she has not taken her methadone in 3 days and has been off of her psychotropic medications for 2 1/2 weeks. Pt reports that she does not believe her medication is working correctly, adding that her depression seems to have intensified over the last month. Pt reports that she has had issues with depression since age 41 however this past month, the pt states the depression has "been too much to handle and she feels she is at the end". Pt reports that when she has been at this level of depression in the past, she has attempted suicide. Pt reports 3 previous suicide attempts that were significant, where she was in the ICU and states "she does not want it to get to that level again" adding that she "doesn't feel safe to go home because she feels like she has in the past". Pt reports depression symptoms that include: insomnia (no sleep in 3 days), loss of appetite, crying spells, isolation, fatigue, hopelessness and irritability. Pt states that it has become difficult for her to get out of bed each day, and she has lost interest in wanting to complete normal daily activities. Pt currently denies SI/HI/AVH and substance use.   Pt has been evaluated by the unit psychiatrist who states that pt requires hospitalization for safety and stabilization.   Pt information being sent to [facility] for review.   Axis I: Bipolar Disorder, Most Recent Episode Depressed, Moderate Axis II: Deferred Axis III:  Past Medical History  Diagnosis Date  . Depression   . Bipolar 2 disorder   . Drug abuse    Axis IV: economic problems and problems  with access to health care services Axis V: 40  Past Medical History:  Past Medical History  Diagnosis Date  . Depression   . Bipolar 2 disorder   . Drug abuse     Past Surgical History  Procedure Date  . Abdominal hysterectomy     Family History: No family history on file.  Social History:  reports that she has been smoking Cigarettes.  She has been smoking about .5 packs per day. She does not have any smokeless tobacco history on file. She reports that she does not drink alcohol. Her drug history not on file.  Additional Social History:     CIWA: CIWA-Ar BP: 128/84 mmHg Pulse Rate: 90  COWS:    Allergies:  Allergies  Allergen Reactions  . Compazine Other (See Comments)    Makes her "panicky"  . Penicillins Rash    Home Medications:  (Not in a hospital admission)  OB/GYN Status:  No LMP recorded. Patient has had a hysterectomy.  General Assessment Data Location of Assessment: [facility] ED Living Arrangements: Other relatives (boyfriend) Can pt return to current living arrangement?: Yes Admission Status: Voluntary Is patient capable of signing voluntary admission?: Yes Transfer from: Acute Hospital Referral Source: Self/Family/Friend  Education Status Is patient currently in school?: No  Risk to self Suicidal Ideation: No-Not Currently/Within Last 6 Months Suicidal Intent: No-Not Currently/Within Last 6 Months Is patient at risk for suicide?: Yes Suicidal Plan?: No-Not Currently/Within Last 6 Months Access to  Means: No What has been your use of drugs/alcohol within the last 12 months?: pt denies use Previous Attempts/Gestures: Yes How many times?: 3  (most recent attempt in approx. 2005) Other Self Harm Risks: pt denies Triggers for Past Attempts: Other (Comment) (depression; mother's death) Intentional Self Injurious Behavior: None Family Suicide History: No Recent stressful life event(s): Financial Problems;Other (Comment) (recently lost mode of  transportation) Persecutory voices/beliefs?: No Depression: Yes Depression Symptoms: Insomnia;Tearfulness;Isolating;Fatigue;Loss of interest in usual pleasures;Feeling angry/irritable (hopelessness) Substance abuse history and/or treatment for substance abuse?: No (pt denies) Suicide prevention information given to non-admitted patients: Not applicable  Risk to Others Homicidal Ideation: No Thoughts of Harm to Others: No Current Homicidal Intent: No Current Homicidal Plan: No Access to Homicidal Means: No Identified Victim: none History of harm to others?: No Assessment of Violence: None Noted Violent Behavior Description: pt is calm and cooperative in the ED Does patient have access to weapons?: No Criminal Charges Pending?: No Does patient have a court date: No  Psychosis Hallucinations: None noted Delusions: None noted  Mental Status Report Appear/Hygiene: Disheveled Eye Contact: Fair Motor Activity: Freedom of movement Speech: Logical/coherent Level of Consciousness: Quiet/awake Mood: Depressed Affect: Appropriate to circumstance;Depressed;Blunted Anxiety Level: None Thought Processes: Coherent;Relevant Judgement: Impaired Orientation: Person;Place;Time;Situation Obsessive Compulsive Thoughts/Behaviors: None  Cognitive Functioning Concentration: Normal Memory: Recent Intact;Remote Intact IQ: Average Insight: Fair Impulse Control: Fair Appetite: Poor Weight Loss: 0  Weight Gain: 0  Sleep: Decreased Total Hours of Sleep: 0  (no sleep in 3 days) Vegetative Symptoms: Staying in bed  ADLScreening [facility]) Patient's cognitive ability adequate to safely complete daily activities?: Yes Patient able to express need for assistance with ADLs?: Yes Independently performs ADLs?: Yes  Abuse/Neglect [facility]) Physical Abuse: Denies Verbal Abuse: Denies Sexual Abuse: Denies  Prior Inpatient Therapy Prior Inpatient Therapy: Yes Prior Therapy Dates:  greater than 3 years ago at [facility] Prior Therapy Facilty/Provider(s): [facility] Reason for Treatment: SI  Prior Outpatient Therapy Prior Outpatient Therapy: Yes Prior Therapy Dates: Current Prior Therapy Facilty/Provider(s): [facility] Reason for Treatment: medication management; methadone for pain management  ADL Screening (condition at time of admission) Patient's cognitive ability adequate to safely complete daily activities?: Yes Patient able to express need for assistance with ADLs?: Yes Independently performs ADLs?: Yes       Abuse/Neglect Assessment (Assessment to be complete while patient is alone) Physical Abuse: Denies Verbal Abuse: Denies Sexual Abuse: Denies Values / Beliefs Cultural Requests During Hospitalization: None Spiritual Requests During Hospitalization: None     Nutrition Screen Diet: Regular  Additional Information 1:1 In Past 12 Months?: No CIRT Risk: No Elopement Risk: No Does patient have medical clearance?: Yes     Disposition:  Disposition Disposition of Patient: Inpatient treatment program;Referred to [facility]) Type of inpatient treatment program: Adult Patient referred to: Other (Comment) [facility])  On Site Evaluation by:   Reviewed with Physician:     [place] Crane F 05/13/2012 9:35 PM

## 2012-05-13 NOTE — ED Notes (Signed)
Pt has been feeling anxiety

## 2012-05-13 NOTE — ED Notes (Signed)
edmd notified pt has not been screen

## 2012-05-14 ENCOUNTER — Inpatient Hospital Stay (HOSPITAL_COMMUNITY)
Admission: AD | Admit: 2012-05-14 | Discharge: 2012-05-19 | DRG: 885 | Disposition: A | Discharge status: Home / Self Care | Payer: Federal, State, Local not specified - Other | Source: Ambulatory Visit | Attending: Psychiatry | Admitting: Psychiatry

## 2012-05-14 ENCOUNTER — Encounter (HOSPITAL_COMMUNITY): Payer: Self-pay

## 2012-05-14 DIAGNOSIS — F5105 Insomnia due to other mental disorder: Secondary | ICD-10-CM

## 2012-05-14 DIAGNOSIS — F332 Major depressive disorder, recurrent severe without psychotic features: Principal | ICD-10-CM | POA: Diagnosis present

## 2012-05-14 DIAGNOSIS — F339 Major depressive disorder, recurrent, unspecified: Secondary | ICD-10-CM | POA: Diagnosis present

## 2012-05-14 DIAGNOSIS — J3489 Other specified disorders of nose and nasal sinuses: Secondary | ICD-10-CM | POA: Diagnosis present

## 2012-05-14 DIAGNOSIS — F489 Nonpsychotic mental disorder, unspecified: Secondary | ICD-10-CM | POA: Diagnosis present

## 2012-05-14 DIAGNOSIS — F909 Attention-deficit hyperactivity disorder, unspecified type: Secondary | ICD-10-CM | POA: Diagnosis present

## 2012-05-14 DIAGNOSIS — F411 Generalized anxiety disorder: Secondary | ICD-10-CM | POA: Diagnosis present

## 2012-05-14 HISTORY — DX: Attention-deficit hyperactivity disorder, unspecified type: F90.9

## 2012-05-14 MED ORDER — MAGNESIUM HYDROXIDE 400 MG/5ML PO SUSP: 30.0000 mL | Freq: Every day | ORAL | Status: DC | PRN

## 2012-05-14 MED ORDER — ALUM & MAG HYDROXIDE-SIMETH 200-200-20 MG/5ML PO SUSP: 30.0000 mL | ORAL | Status: DC | PRN

## 2012-05-14 MED ORDER — CHLORDIAZEPOXIDE HCL 25 MG PO CAPS
25.0000 mg | ORAL_CAPSULE | Freq: Four times a day (QID) | ORAL | Status: DC | PRN
Start: 2012-05-14 — End: 2012-05-15
  Administered 2012-05-14 – 2012-05-15 (×2): 25 mg via ORAL
  Filled 2012-05-14 (×2): qty 1

## 2012-05-14 MED ORDER — NAPHAZOLINE-PHENIRAMINE 0.025-0.3 % OP SOLN
2.0000 [drp] | Freq: Four times a day (QID) | OPHTHALMIC | Status: DC | PRN
  Filled 2012-05-14: qty 15

## 2012-05-14 MED ORDER — METHADONE HCL 10 MG PO TABS
130.0000 mg | ORAL_TABLET | Freq: Every day | ORAL | Status: DC
  Administered 2012-05-14: 130 mg via ORAL
  Filled 2012-05-14: qty 13

## 2012-05-14 MED ORDER — MUSCLE RUB 10-15 % EX CREA: 1.0000 "application " | TOPICAL_CREAM | CUTANEOUS | Status: DC | PRN

## 2012-05-14 MED ORDER — SERTRALINE HCL 100 MG PO TABS
100.0000 mg | ORAL_TABLET | Freq: Every day | ORAL | Status: DC
  Administered 2012-05-15: 100 mg via ORAL
  Filled 2012-05-14 (×2): qty 1

## 2012-05-14 MED ORDER — ACETAMINOPHEN 325 MG PO TABS
650.0000 mg | ORAL_TABLET | Freq: Four times a day (QID) | ORAL | Status: DC | PRN
Start: 2012-05-14 — End: 2012-05-19
  Administered 2012-05-17 – 2012-05-19 (×4): 650 mg via ORAL

## 2012-05-14 MED ORDER — QUETIAPINE FUMARATE 300 MG PO TABS
300.0000 mg | ORAL_TABLET | Freq: Every day | ORAL | Status: DC
  Administered 2012-05-14: 150 mg via ORAL
  Filled 2012-05-14 (×4): qty 1

## 2012-05-14 MED ORDER — METHADONE HCL 10 MG/ML PO CONC: 130.0000 mg | Freq: Every day | ORAL | Status: DC

## 2012-05-14 MED ORDER — TRAZODONE HCL 50 MG PO TABS: 50.0000 mg | ORAL_TABLET | Freq: Every evening | ORAL | Status: DC | PRN

## 2012-05-14 NOTE — Progress Notes (Signed)
Patient ID: [name], female   DOB: June 14, 1966, 46 y.o.   MRN: [id number] Voluntary admit. Pt denies SI/HI/AVH. Pt denies any physical, verbal, or sexual abuse. Pt admitted for increased depression over the past month. Pt feels that the Zoloft in no longer working. Pt has had 3 suicide attempts in the past. She was beginning to get the same feelings of depression and hopelessness that triggered her in the past so she admitted herself into the ED. Pt was here at [facility] about 3 years ago. Pt goes to methadone clinic and takes 130mg  of methadone daily. UDS positive for amphetamine and benzo's. Pt unemployed and living with her boyfriend.

## 2012-05-14 NOTE — Tx Team (Signed)
Initial Interdisciplinary Treatment Plan  PATIENT STRENGTHS: (choose at least two) Ability for insight Average or above average intelligence General fund of knowledge  PATIENT STRESSORS: Ineffective Medication Therapy   PROBLEM LIST: Problem List/Patient Goals Date to be addressed Date deferred Reason deferred Estimated date of resolution  Depression                                                       DISCHARGE CRITERIA:  Improved stabilization in mood, thinking, and/or behavior  PRELIMINARY DISCHARGE PLAN: Outpatient therapy  PATIENT/FAMIILY INVOLVEMENT: This treatment plan has been presented to and reviewed with the patient, [place], and/or family member.  The patient and family have been given the opportunity to ask questions and make suggestions.  Gretta Arab [facility] 05/14/2012, 7:31 PM

## 2012-05-14 NOTE — ED Provider Notes (Signed)
8:25 AM The pt is on 130mg  of methadone once a day and has been since 2010. This was verified with the methadone clinic by me personally after discussions with the pharmacist and the nurse. Last dose 2 days ago. No signs of withdrawal at this time  Lyanne Co, MD 05/14/12 [phone]

## 2012-05-14 NOTE — Progress Notes (Signed)
Behavioral Health Group  Co-facilitated behavioral health group w/ chaplain Ashley Mariner, MDiv, for pt's in Psych ED. Group focused on crisis prevention via recognizing individual warning signs. Engaged group members in activity in which they labeled parts of the body in which they feel varying emotions (e.g., stress, depression, anxiety, etc) prior to becoming overwhelmed. Group was open and engaged w/ sharing and mutual support. Group occ had to be redirected/focused by facilitators.  Pt was open and engaged in the group. Pt shared that she was in Psych ED d/t not being able to afford rx for bipolar dx and being off meds for several days. Pt stated she began to feel overwhelmed w/ Si ideation and called 911 for help. Pt stated this was first time she had reached out for help when suicidal, in the past she had made attempts rather than reach out for help. Pt recognized and owned her strength in reaching out. Pt engaged in the activity, identified dark thoughts, feeling hot/sweaty and tense, and insomnia for several days as early warning signs that she is getting overwhelmed. Pt identified reaching out to bf or to professional help as well as maintaining medications as ways she can intervene early upon recognizing her early warning signs.  Phineas Mcenroe B MS, LPCA, NCC

## 2012-05-14 NOTE — ED Notes (Signed)
Pt takes 130mg  methadone daily per [facility] at [facility].  [phone]

## 2012-05-14 NOTE — BH Assessment (Signed)
Assessment Note   [name] is an 46 y.o. female. Pt initially presented to the [facility] 05/13/12 for medical clearance. Pt reports that she has a previous mental health diagnosis of Bipolar Disorder and ADHD. Pt's outpatient psychiatrist is Dr. Ladona Ridgel at [facility] and she goes to [facility] for methadone treatment. Pt denies any substance use and states that she gets methadone for pain management. Pt states that she has not taken her methadone in 3 days and has been off of her psychotropic medications for 2 1/2 weeks. Pt reports that she does not believe her medication is working correctly, adding that her depression seems to have intensified over the last month. Pt reports that she has had issues with depression since age 63 however this past month, the pt states the depression has "been too much to handle and she feels she is at the end". Pt reports that when she has been at this level of depression in the past, she has attempted suicide. Pt reports 3 previous suicide attempts that were significant, where she was in the ICU and states "she does not want it to get to that level again" adding that she "doesn't feel safe to go home because she feels like she has in the past". Pt reports depression symptoms that include: insomnia (no sleep in 3 days), loss of appetite, crying spells, isolation, fatigue, hopelessness and irritability. Pt states that it has become difficult for her to get out of bed each day, and she has lost interest in wanting to complete normal daily activities. Pt currently denies SI/HI/AVH and substance use.  Pt has been evaluated by the unit psychiatrist who states that pt requires hospitalization for safety and stabilization.   Pt accepted to [facility] Dr. Theotis Barrio to Dr. Allena Katz ([id number]). Completed support documentation. Updated EDP & RN. Pt is voluntary & to be transported via security.  Axis I: Bipolar, Depressed Axis II: Deferred Axis III:  Past Medical History  Diagnosis Date  .  Depression   . Bipolar 2 disorder   . Drug abuse    Axis IV: economic problems and other psychosocial or environmental problems Axis V: 31-40 impairment in reality testing  Past Medical History:  Past Medical History  Diagnosis Date  . Depression   . Bipolar 2 disorder   . Drug abuse     Past Surgical History  Procedure Date  . Abdominal hysterectomy     Family History: No family history on file.  Social History:  reports that she has been smoking Cigarettes.  She has been smoking about .5 packs per day. She does not have any smokeless tobacco history on file. She reports that she does not drink alcohol. Her drug history not on file.  Additional Social History:     CIWA: CIWA-Ar BP: 131/84 mmHg Pulse Rate: 113  COWS:    Allergies:  Allergies  Allergen Reactions  . Compazine Other (See Comments)    Makes her "panicky"  . Penicillins Rash    Home Medications:  (Not in a hospital admission)  OB/GYN Status:  No LMP recorded. Patient has had a hysterectomy.  General Assessment Data Location of Assessment: [facility] Living Arrangements: Other relatives (boyfriend) Can pt return to current living arrangement?: Yes Admission Status: Voluntary Is patient capable of signing voluntary admission?: Yes Transfer from: Acute Hospital Referral Source: Self/Family/Friend  Education Status Is patient currently in school?: No  Risk to self Suicidal Ideation: No-Not Currently/Within Last 6 Months Suicidal Intent: No-Not Currently/Within Last 6 Months Is  patient at risk for suicide?: Yes Suicidal Plan?: No-Not Currently/Within Last 6 Months Access to Means: No What has been your use of drugs/alcohol within the last 12 months?: pt denies Previous Attempts/Gestures: Yes How many times?: 3  (most recent in 2005) Other Self Harm Risks: n/a Triggers for Past Attempts: Other (Comment) (depression, death of mother) Intentional Self Injurious Behavior: None Family Suicide History:  No Recent stressful life event(s): Financial Problems Persecutory voices/beliefs?: No Depression: Yes Depression Symptoms: Insomnia;Tearfulness;Isolating;Fatigue;Loss of interest in usual pleasures;Feeling angry/irritable Substance abuse history and/or treatment for substance abuse?: No Suicide prevention information given to non-admitted patients: Not applicable  Risk to Others Homicidal Ideation: No Thoughts of Harm to Others: No Current Homicidal Intent: No Current Homicidal Plan: No Access to Homicidal Means: No Identified Victim: N/A History of harm to others?: No Assessment of Violence: None Noted Violent Behavior Description: pt is calm and cooperative in the ED Does patient have access to weapons?: No Criminal Charges Pending?: No Does patient have a court date: No  Psychosis Hallucinations: None noted Delusions: None noted  Mental Status Report Appear/Hygiene: Disheveled Eye Contact: Fair Motor Activity: Freedom of movement Speech: Logical/coherent Level of Consciousness: Quiet/awake Mood: Depressed Affect: Appropriate to circumstance;Depressed Anxiety Level: Minimal Thought Processes: Coherent;Relevant Judgement: Impaired Orientation: Person;Place;Time;Situation Obsessive Compulsive Thoughts/Behaviors: None  Cognitive Functioning Concentration: Normal Memory: Recent Intact;Remote Intact IQ: Average Insight: Fair Impulse Control: Fair Appetite: Poor Weight Loss: 0  Weight Gain: 0  Sleep: Decreased Total Hours of Sleep: 0  (no sleep in 3 days) Vegetative Symptoms: Staying in bed  ADLScreening [facility] Assessment Services) Patient's cognitive ability adequate to safely complete daily activities?: Yes Patient able to express need for assistance with ADLs?: Yes Independently performs ADLs?: Yes  Abuse/Neglect [facility]) Physical Abuse: Denies Verbal Abuse: Denies Sexual Abuse: Denies  Prior Inpatient Therapy Prior Inpatient Therapy: Yes Prior Therapy  Dates: greater than 3 years ago at [facility] Prior Therapy Facilty/Provider(s): [facility] Reason for Treatment: SI  Prior Outpatient Therapy Prior Outpatient Therapy: Yes Prior Therapy Dates: Current Prior Therapy Facilty/Provider(s): [facility] Reason for Treatment: medication management; methadone for pain management  ADL Screening (condition at time of admission) Patient's cognitive ability adequate to safely complete daily activities?: Yes Patient able to express need for assistance with ADLs?: Yes Independently performs ADLs?: Yes       Abuse/Neglect Assessment (Assessment to be complete while patient is alone) Physical Abuse: Denies Verbal Abuse: Denies Sexual Abuse: Denies Exploitation of patient/patient's resources: Denies Self-Neglect: Denies Values / Beliefs Cultural Requests During Hospitalization: None Spiritual Requests During Hospitalization: None   Advance Directives (For Healthcare) Advance Directive: Patient does not have advance directive;Patient would not like information Pre-existing out of facility DNR order (yellow form or pink MOST form): No Nutrition Screen Diet: Regular  Additional Information 1:1 In Past 12 Months?: No CIRT Risk: No Elopement Risk: No Does patient have medical clearance?: Yes     Disposition:  Disposition Disposition of Patient: Inpatient treatment program Type of inpatient treatment program: Adult Patient referred to: Other (Comment) (Accepted [facility] [place] ([id number]))  On Site Evaluation by:   Reviewed with Physician:     Romeo Apple 05/14/2012 4:34 PM

## 2012-05-14 NOTE — ED Notes (Signed)
Attempted to call report.  Charge nurse Lupita Leash advised to call back in one hour.

## 2012-05-15 DIAGNOSIS — F339 Major depressive disorder, recurrent, unspecified: Secondary | ICD-10-CM | POA: Diagnosis present

## 2012-05-15 DIAGNOSIS — F411 Generalized anxiety disorder: Secondary | ICD-10-CM | POA: Diagnosis present

## 2012-05-15 MED ORDER — QUETIAPINE FUMARATE 50 MG PO TABS
150.0000 mg | ORAL_TABLET | Freq: Every day | ORAL | Status: DC
  Administered 2012-05-15 – 2012-05-18 (×3): 150 mg via ORAL
  Filled 2012-05-15 (×5): qty 1
  Filled 2012-05-15: qty 3

## 2012-05-15 MED ORDER — SERTRALINE HCL 100 MG PO TABS
200.0000 mg | ORAL_TABLET | Freq: Every day | ORAL | Status: DC
  Administered 2012-05-16 – 2012-05-19 (×4): 200 mg via ORAL
  Filled 2012-05-15 (×4): qty 2
  Filled 2012-05-15: qty 6
  Filled 2012-05-15: qty 2

## 2012-05-15 MED ORDER — ZIPRASIDONE HCL 20 MG PO CAPS
ORAL_CAPSULE | ORAL | Status: AC
  Filled 2012-05-15: qty 1

## 2012-05-15 MED ORDER — METHADONE HCL 5 MG PO TABS: 130.0000 mg | ORAL_TABLET | Freq: Every day | ORAL | Status: DC

## 2012-05-15 MED ORDER — BUPROPION HCL ER (SR) 150 MG PO TB12
150.0000 mg | ORAL_TABLET | Freq: Every day | ORAL | Status: DC
  Administered 2012-05-16 – 2012-05-19 (×4): 150 mg via ORAL
  Filled 2012-05-15 (×5): qty 1

## 2012-05-15 MED ORDER — METHADONE HCL 5 MG PO TABS
130.0000 mg | ORAL_TABLET | Freq: Every day | ORAL | Status: DC
  Administered 2012-05-15: 130 mg via ORAL
  Filled 2012-05-15: qty 26

## 2012-05-15 MED ORDER — NICOTINE 21 MG/24HR TD PT24
21.0000 mg | MEDICATED_PATCH | Freq: Every day | TRANSDERMAL | Status: DC
  Administered 2012-05-15 – 2012-05-19 (×5): 21 mg via TRANSDERMAL
  Filled 2012-05-15 (×7): qty 1

## 2012-05-15 MED ORDER — ALPRAZOLAM 0.5 MG PO TABS
0.5000 mg | ORAL_TABLET | Freq: Three times a day (TID) | ORAL | Status: DC | PRN
  Administered 2012-05-15 – 2012-05-16 (×3): 0.5 mg via ORAL
  Filled 2012-05-15 (×3): qty 1

## 2012-05-15 MED ORDER — TRAZODONE HCL 100 MG PO TABS: 100.0000 mg | ORAL_TABLET | Freq: Every evening | ORAL | Status: DC | PRN

## 2012-05-15 NOTE — Tx Team (Signed)
Interdisciplinary Treatment Plan Update (Adult)  Date:  05/15/2012  Time Reviewed:  10:15AM-11:15AM  Progress in Treatment: Attending groups:  Yes, even though new Participating in groups:    Yes Taking medication as prescribed:    Yes Tolerating medication:   Yes Family/Significant other contact made:  Not yet, new patient Patient understands diagnosis:   Yes Discussing patient identified problems/goals with staff:   Yes Medical problems stabilized or resolved:   Yes Denies suicidal/homicidal ideation:  Yes Issues/concerns per patient self-inventory:   None Other:    New problem(s) identified: No, Describe:    Reason for Continuation of Hospitalization: Depression Medication stabilization Other; describe hopelessness, lack of sleep, lack of appetite  Interventions implemented related to continuation of hospitalization:  Medication monitoring and adjustment, safety checks Q15 min., suicide risk assessment, group therapy, psychoeducation, collateral contact, aftercare planning, ongoing physician assessments, medication education  Additional comments:  Patient to move to [address] hall for a better fit with her needs  Estimated length of stay:  3-4 days  Discharge Plan:  To be addressed with Case Manager during stay  New goal(s):  Not applicable  Review of initial/current patient goals per problem list:   1.  Goal(s):  Reduce depression to no greater than 3/10 at discharge.  Met:  No  Target date:  By Discharge   As evidenced by:  "9" today  2.  Goal(s):  Reduce hopelessness to no greater than 3/10 at discharge.  Met:  No  Target date:  By Discharge   As evidenced by:  "9" today  3.  Goal(s):  Medication stabilization  Met:  No  Target date:  By Discharge   As evidenced by:  New patient  4.  Goal(s):  Return sleep and appetite to normal levels, i.e. At least 6+ hours of sleep nightly.  Met:  No  Target date:  By Discharge   As evidenced by:  Poor sleep and  appetite reported today  Attendees: Patient:  [name]  05/15/2012 10:15AM-11:15AM  Family:     Physician:  Dr. Harvie Heck Readling 05/15/2012 10:15AM-11:15AM  Nursing:   Edwyna Shell, RN 05/15/2012 10:15AM -11:15AM   Case Manager:  Juline Patch, LCSW 05/15/2012 10:15AM-11:15AM  Counselor:  Veto Kemps, MT-BC 05/15/2012 10:15AM-11:15AM  Other:   Ambrose Mantle, LCSW 05/15/2012 10:15AM-11:15AM  Other:      Other:      Other:       Scribe for Treatment Team:   Sarina Ser, 05/15/2012, 10:15AM-11:15AM

## 2012-05-15 NOTE — Progress Notes (Signed)
Pt states she slept fair, appetite is improving, energy level is low. Pt rates her depression as a 8, hopelessness as a 7. Pt denies SI/HI, A/V hallucinations. Pt's goal is to "stay in touch with my care plan".  Pt to be moved to 500 hall when bed available. Pt seems very vested in treatment. Mood: anxious.

## 2012-05-15 NOTE — Progress Notes (Signed)
Patient seen to assess for discharge planning needs.  She advised of admitting to the hospital due to having SI.  She currently denies SI/HI.  She rates depression at eight/nine, anxiety at four, hopelessness at seven and helplessness at five.  She reports having a place to live and transportation home. She will need assistance with indigent medications.  She is followed outpatient by [facility].

## 2012-05-15 NOTE — Progress Notes (Signed)
[facility] Group Notes:  (Counselor/Nursing/MHT/Case Management/Adjunct)  05/15/2012 1:13 PM  Type of Therapy:  Group Therapy  Participation Level:  Active  Participation Quality:  Appropriate  Affect:  Depressed  Cognitive:  Appropriate  Insight:  Good  Engagement in Group:  Good  Engagement in Therapy:  Good  Modes of Intervention:  Clarification, Education, Problem-solving and Support  Summary of Progress/Problems: Patient talked about her depression and getting help before it got worse. She stated she called two family members and they were too busy to help her but she didn't give up and called the ambulance. She said her boyfriend was upset with her for calling the ambulance but she has had several SI attempts in the past and didn't want to do this again. She talked about several losses and that medications weren't working. Talked about communication problems with her boyfriend but realized that she need to let him know what she is feeling instead of being angry with him.   [name], Aram Beecham 05/15/2012, 1:13 PM
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
## 2012-05-15 NOTE — [facility] Counselor (Signed)
Psychoeducational Group Note  Date:  05/15/2012 Time:  11am  Group Topic/Focus:  Balance  Participation Level:  Active  Participation Quality:  Appropriate  Affect:  Appropriate and Depressed  Cognitive:  Appropriate  Insight:  Good  Engagement in Group:  Good  Additional Comments:  [name] was engaged in group and reported that she wants to find balance in her life and understands the importance of balance.  [name] 05/15/2012, 12:12 PM

## 2012-05-15 NOTE — Progress Notes (Signed)
Patient appeared to be doing well this evening. She denied SI/HI and denied hallucinations. Her mood and affects appropriate. She reported that she felt her medications not working, but the doctor is going to start her on Wellbutrin tomorrow. She seemed to be looking forward to that. She is attending group and interacting well. Q 15 minute check continues to maintain safety.

## 2012-05-15 NOTE — [facility] Suicide Risk Assessment (Signed)
Suicide Risk Assessment  Admission Assessment     Demographic factors:  Assessment Details Time of Assessment: Admission Information Obtained From: Patient Current Mental Status:    Loss Factors:  Loss Factors: Financial problems / change in socioeconomic status Historical Factors:  Historical Factors: Prior suicide attempts;Family history of mental illness or substance abuse Risk Reduction Factors:  Risk Reduction Factors: Living with another person, especially a relative  CLINICAL FACTORS:   Severe Anxiety and/or Agitation Depression:   Anhedonia Hopelessness Insomnia Chronic Pain More than one psychiatric diagnosis Previous Psychiatric Diagnoses and Treatments Medical Diagnoses and Treatments/Surgeries  COGNITIVE FEATURES THAT CONTRIBUTE TO RISK:  None Noted.   Diagnosis:  Axis I:  Major Depressive Disorder - Recurrent.  Generalized Anxiety Disorder.   The patient was seen today and reports the following:   ADL's: Intact.  Sleep: The patient reports to having significant difficulty initiating and maintaining sleep.  Appetite: The patient reports a decreased appetite today.   Mild>(1-10) >Severe  Hopelessness (1-10): 9  Depression (1-10): 9  Anxiety (1-10): 4   Suicidal Ideation: The patient denies any current suicidal ideations today.  Plan: No  Intent: No  Means: No   Homicidal Ideation: The patient adamantly denies any homicidal ideations today.  Plan: No  Intent: No.  Means: No   General Appearance/Behavior: The patient was cooperative today with the treatment team.  Eye Contact: Good.  Speech: Appropriate in rate and volume with no pressuring noted today.  Motor Behavior: wnl.  Level of Consciousness: Alert and Oriented x 3.  Mental Status: Alert and Oriented x 3.  Mood: Severely depressed today.  Affect: Moderately constricted.  Anxiety Level: Moderate anxiety reported today.  Thought Process: wnl.  Thought Content: The patient denies any auditory  or visual hallucinations today. She also denies any delusional thinking. Perception: wnl.  Judgment: Good.  Insight: Good.  Cognition: Oriented to person, place and time.   Review of Systems:  Neurological: The patient denies any headaches today. She denies any seizures or dizziness.  G.I.: The patient denies any constipation or G.I. Upset today.  Musculoskeletal: The patient reports chronic back pain secondary to degenerative disc disease.   Time was spent today discussing with the patient her current symptoms. The patient reports having difficulty initiating and maintaining sleep.  She also reports a poor appetite as well as severe feelings of sadness, anhedonia and depressed mood.  The patient denies any current suicidal or homicidal ideations but states that she was having suicidal ideations prior to admission.  She denies any auditory or visual hallucinations or delusional thinking.  She does report moderate anxiety symptoms.  The patient states that she has 3 past suicidal gestures and 2 to 3 past admissions.  Treatment Plan Summary:  1. Daily contact with patient to assess and evaluate symptoms and progress in treatment.  2. Medication management  3. The patient will deny suicidal ideations or homicidal ideations for 48 hours prior to discharge and have a depression and anxiety rating of 3 or less. The patient will also deny any auditory or visual hallucinations or delusional thinking.  4. The patient will deny any symptoms of substance withdrawal at time of discharge.   Plan:  1. Will continue the medication Zoloft at 200 mgs po q am for depression. 2. Will start the medication Wellbutrin SR at 150 mgs po q am to also address the patient's depressive symptoms.  2. Will continue the medication Xanax but at a slightly reduced dosage of 0.5 mgs po  TID - prn for anxiety.  3. Will continue the medication Seroquel at 150 mgs po qhs for sleep and mood stabilization. 4. Will continue the  medication Methadone at 130 mgs po q 6 am for chronic pain.  5. Will start the medication Trazodone at 100 mgs po qhs - prn for sleep. 6. Will not restart the medication Adderall at this time. 7. Will continue the patient's non-psychiatric medications.   8. Laboratory studies reviewed.  9. Will continue to monitor.  10. The treatment team has decided that the patient;s care would be best facilitated by a transfer to the mood disorder hall of 500 with Dr. Dan Humphreys as the Attending.  SUICIDE RISK:   Mild:  Suicidal ideation of limited frequency, intensity, duration, and specificity.  There are no identifiable plans, no associated intent, mild dysphoria and related symptoms, good self-control (both objective and subjective assessment), few other risk factors, and identifiable protective factors, including available and accessible social support.  [name] 05/15/2012, 4:14 PM

## 2012-05-16 DIAGNOSIS — F909 Attention-deficit hyperactivity disorder, unspecified type: Secondary | ICD-10-CM | POA: Diagnosis present

## 2012-05-16 DIAGNOSIS — F5105 Insomnia due to other mental disorder: Secondary | ICD-10-CM | POA: Diagnosis present

## 2012-05-16 MED ORDER — QUETIAPINE FUMARATE 25 MG PO TABS
25.0000 mg | ORAL_TABLET | Freq: Three times a day (TID) | ORAL | Status: DC | PRN
  Administered 2012-05-16 – 2012-05-17 (×3): 25 mg via ORAL
  Filled 2012-05-16 (×3): qty 1

## 2012-05-16 MED ORDER — METHADONE HCL 10 MG PO TABS
130.0000 mg | ORAL_TABLET | Freq: Every day | ORAL | Status: DC
  Administered 2012-05-16 – 2012-05-19 (×4): 130 mg via ORAL
  Filled 2012-05-16 (×4): qty 13

## 2012-05-16 MED ORDER — AMPHETAMINE-DEXTROAMPHETAMINE 10 MG PO TABS
30.0000 mg | ORAL_TABLET | Freq: Every day | ORAL | Status: DC
  Administered 2012-05-17 – 2012-05-19 (×3): 30 mg via ORAL
  Filled 2012-05-16 (×3): qty 3

## 2012-05-16 MED ORDER — RAMELTEON 8 MG PO TABS
8.0000 mg | ORAL_TABLET | Freq: Every day | ORAL | Status: DC
  Administered 2012-05-16 – 2012-05-18 (×3): 8 mg via ORAL
  Filled 2012-05-16 (×5): qty 1

## 2012-05-16 MED ORDER — METHADONE HCL 5 MG PO TABS: 130.0000 mg | ORAL_TABLET | Freq: Every day | ORAL | Status: DC

## 2012-05-16 MED ORDER — FLEET ENEMA 7-19 GM/118ML RE ENEM
1.0000 | ENEMA | Freq: Two times a day (BID) | RECTAL | Status: AC
  Filled 2012-05-16 (×4): qty 1

## 2012-05-16 MED ORDER — FLEET ENEMA 7-19 GM/118ML RE ENEM
1.0000 | ENEMA | Freq: Two times a day (BID) | RECTAL | Status: DC | PRN
  Administered 2012-05-19: 1 via RECTAL

## 2012-05-16 MED ORDER — FLEET ENEMA 7-19 GM/118ML RE ENEM
1.0000 | ENEMA | Freq: Once | RECTAL | Status: AC
  Administered 2012-05-16: 1 via RECTAL
  Filled 2012-05-16 (×2): qty 1

## 2012-05-16 NOTE — Progress Notes (Signed)
D) Pt rates her hopelessness and her helplessness between an 8-9. Denies SI and HI. States she "plans to stay on my meds and have better communication with my family and boyfriend". Also states she is having trouble with getting rides to her appointments and to pick up her medications. States she is going to speak with her family to help with transportation. Has attended the program. A) Given support, reassurance and praise. Remains on 15 minute checks for her safety.  R) Denies SI and HI.

## 2012-05-16 NOTE — Progress Notes (Signed)
[facility] MD Progress Note  05/16/2012 4:53 PM  Diagnosis:   Axis I: Generalized Anxiety Disorder and Major Depression, Recurrent severe Axis II: Deferred Axis III:  Past Medical History  Diagnosis Date  . Drug abuse   . Depression   . Bipolar 2 disorder   . Attention deficit hyperactivity disorder (ADHD)     ADL's:  Intact  Sleep: Good, with Xanax. It does cause cloudy thinking, so will try Rozerem with the Seroquel.  Appetite:  Fair  Suicidal Ideation:  Pt has had some thoughts of wishing she were dead this AM.  Can contract for safety. Homicidal Ideation:  Denies adamantly any homicidal thoughts.  Mental Status Examination/Evaluation: Objective:  Appearance: Casual  Eye Contact::  Good  Speech:  Clear and Coherent  Volume:  Normal  Mood:  Anxious, Depressed, Hopeless, Irritable and Worthless  Affect:  Blunt  Thought Process:  Coherent  Orientation:  Full  Thought Content:  WDL  Suicidal Thoughts:  Yes.  without intent/plan  Homicidal Thoughts:  No  Memory:  Immediate;   Fair  Judgement:  Fair  Insight:  Fair  Psychomotor Activity:  Normal  Concentration:  Fair  Recall:  Fair  Akathisia:  No  Handed:  Right  AIMS (if indicated):     Assets:  Communication Skills Desire for Improvement  Sleep:  Number of Hours: 6.5    ROS: Neuro: no headaches, ataxia, weakness  GI: no N/V/D/cramps, but does have chronic constipation,  Will use Fleets enema and then follow up with Colace.  MS: no weakness, muscle cramps, aches.  Vital Signs:Blood pressure 127/73, pulse 80, temperature 98.2 F (36.8 C), temperature source Oral, resp. rate 16, height 5\' 6"  (1.676 m), weight 86.637 kg (191 lb). Current Medications: Current Facility-Administered Medications  Medication Dose Route Frequency Provider Last Rate Last Dose  . acetaminophen (TYLENOL) tablet 650 mg  650 mg Oral Q6H PRN Mickie D. Adams, PA      . alum & mag hydroxide-simeth (MAALOX/MYLANTA) 200-200-20 MG/5ML suspension  30 mL  30 mL Oral Q4H PRN Mickie D. Adams, PA      . amphetamine-dextroamphetamine (ADDERALL) tablet 30 mg  30 mg Oral Q breakfast Mike Craze, MD      . buPROPion [facility] SR) 12 hr tablet 150 mg  150 mg Oral Q breakfast Curlene Labrum Readling, MD   150 mg at 05/16/12 0815  . magnesium hydroxide (MILK OF MAGNESIA) suspension 30 mL  30 mL Oral Daily PRN Mickie D. Adams, PA      . methadone (DOLOPHINE) tablet 130 mg  130 mg Oral Daily Ky Barban Absher, PHARMD   130 mg at 05/16/12 0627  . Muscle Rub CREA 1 application  1 application Topical PRN Mickie D. Adams, PA      . nicotine (NICODERM CQ - dosed in mg/24 hours) patch 21 mg  21 mg Transdermal Daily Curlene Labrum Readling, MD   21 mg at 05/16/12 0815  . QUEtiapine (SEROQUEL) tablet 150 mg  150 mg Oral QHS Curlene Labrum Readling, MD   150 mg at 05/15/12 2133  . QUEtiapine (SEROQUEL) tablet 25 mg  25 mg Oral TID PRN Mike Craze, MD      . ramelteon (ROZEREM) tablet 8 mg  8 mg Oral QHS Mike Craze, MD      . sertraline (ZOLOFT) tablet 200 mg  200 mg Oral Daily Verne Spurr, PA-C   200 mg at 05/16/12 0815  . sodium phosphate (FLEET) 7-19 GM/118ML enema 1  enema  1 enema Rectal Once Mike Craze, MD       Followed by  . sodium phosphate (FLEET) 7-19 GM/118ML enema 1 enema  1 enema Rectal BID Mike Craze, MD       Followed by  . sodium phosphate (FLEET) 7-19 GM/118ML enema 1 enema  1 enema Rectal BID PRN Mike Craze, MD      . traZODone (DESYREL) tablet 100 mg  100 mg Oral QHS PRN,MR X 1 Randy D Readling, MD      . ziprasidone (GEODON) 20 MG capsule           . DISCONTD: ALPRAZolam Prudy Feeler) tablet 0.5 mg  0.5 mg Oral TID PRN Curlene Labrum Readling, MD   0.5 mg at 05/16/12 0913  . DISCONTD: methadone (DOLOPHINE) tablet 130 mg  130 mg Oral Daily Curlene Labrum Readling, MD      . DISCONTD: methadone (DOLOPHINE) tablet 130 mg  130 mg Oral Q0600 Mike Craze, MD        Lab Results: No results found for this or any previous visit (from the past 48  hour(s)).  Physical Findings: AIMS:  , ,  ,  ,    CIWA:    COWS:     Treatment Plan Summary: Daily contact with patient to assess and evaluate symptoms and progress in treatment Medication management Mood/anxiety less than 3/10 where 1 is the best and 10 is the worst  Plan: Keep current effective antidepressant medications going.  Consider D/C Sun or Mon. Will switch out her Xanax for small doses of her Seroquel to see if that helps her anxiety. Stop Trazodone as she can not tolerate that either. Has ADHD will continue Adderall Will add Rozerem for insomnia.  Howard Patton 05/16/2012, 4:53 PM

## 2012-05-16 NOTE — Progress Notes (Signed)
[facility] Group Notes:  (Counselor/Nursing/MHT/Case Management/Adjunct)  05/16/2012 1:15 PM  Type of Therapy:  Group Therapy, Dance/Movement Therapy   Participation Level:  Did Not Attend   [name]  

## 2012-05-16 NOTE — [facility] Counselor (Signed)
Adult Comprehensive Assessment  Patient ID: [name], female   DOB: 05/03/1966, 46 y.o.   MRN: [id number]  Information Source: Information source: Patient  Current Stressors:  Educational / Learning stressors: no issues reported Employment / Job issues: unemployed since 2002 due to depression Family Relationships: no issues reported Surveyor, quantity / Lack of resources (include bankruptcy): income from boyfriend, dependent on him, they lost their vehicle, other financial problems Housing / Lack of housing: no issues reported Physical health (include injuries & life threatening diseases): degenerative disc disease, gained a lot of weight Social relationships: patient has been isolating from others Substance abuse: no issues reported Bereavement / Loss: no issues reported  Living/Environment/Situation:  Living Arrangements: Spouse/significant other (living with boyfriend) Living conditions (as described by patient or guardian): lives in home left by boyfriend's father How long has patient lived in current situation?: 10 years What is atmosphere in current home: Comfortable  Family History:  Marital status: Divorced Divorced, when?: couldn't remember when. Married  in 1987 for 17 years What types of issues is patient dealing with in the relationship?: financial problems Additional relationship information: feels like he doesn't understand her depression Does patient have children?: Yes How many children?: 3  (2 boys and 1 girl  ages 46,23,and 37) How is patient's relationship with their children?: 2 sons in [employer], one has returned home, doesn't get to see a lot, very tight with daughter but hasn't seen in 2 months due to her depression  Childhood History:  By whom was/is the patient raised?: Both parents Additional childhood history information: Father was an alcoholic but a 'happy alcoholic' Description of patient's relationship with caregiver when they were a child: fine,  normal Patient's description of current relationship with people who raised him/her: both deceased, Father when patient was 45 and mother in 11 Does patient have siblings?: Yes Number of Siblings: 2  (1/2 brothers, much 12 plus years older) Description of patient's current relationship with siblings: brother is her only family Did patient suffer any verbal/emotional/physical/sexual abuse as a child?: No Did patient suffer from severe childhood neglect?: No Has patient ever been sexually abused/assaulted/raped as an adolescent or adult?: No Was the patient ever a victim of a crime or a disaster?: No Witnessed domestic violence?: No Has patient been effected by domestic violence as an adult?: No  Education:  Highest grade of school patient has completed: graduated high school, phlebotomy from [employer] Currently a student?: No Learning disability?: No  Employment/Work Situation:   Employment situation: Unemployed (since 2002) Patient's job has been impacted by current illness: Yes Describe how patient's job has been implacted: depression What is the longest time patient has a held a job?: 4 years Where was the patient employed at that time?: [employer] Has patient ever been in the [employer]?: No Has patient ever served in Buyer, retail?: No  Financial Resources:   Financial resources: Income from spouse Does patient have a representative payee or guardian?: No  Alcohol/Substance Abuse:   What has been your use of drugs/alcohol within the last 12 months?: none reported If attempted suicide, did drugs/alcohol play a role in this?: No Alcohol/Substance Abuse Treatment Hx: Denies past history Has alcohol/substance abuse ever caused legal problems?: No  Social Support System:   Patient's Community Support System: Good Describe Community Support System: brother-Chad, boyfriend-Brian, and nephew-Jody Type of faith/religion: Baptist,  How does patient's faith help to cope with  current illness?: prayer  Leisure/Recreation:   Leisure and Hobbies: spend time  with daughter and grandaughter  Strengths/Needs:   What things does the patient do well?: good people person, good mother and grandmother In what areas does patient struggle / problems for patient: depression, procrastination  Discharge Plan:   Does patient have access to transportation?: Yes (brother or nephew) Will patient be returning to same living situation after discharge?: Yes Currently receiving community mental health services: Yes (From Whom) Vesta Mixer has an appointment August 1) Does patient have financial barriers related to discharge medications?: No (no income but boyfriend will help with medications)  Summary/Recommendations:   Summary and Recommendations (to be completed by the evaluator): Patient is a 46 year old white female with diagnosis of Bipolar, D/O, Depressed. She was admitted with suicidal thoughts and increased depression. She has been off her medications for 2 1/2 weeks because she thought they were not working. She has been isolating and energy level and interests have decreased. Patient would benefit from crisis stabilization, medication evaluation, group therapy and psychoeducation groups to work on  coping skills, case management for referrals and  counselor to contact family for suicide prevention.    Marion Seese, Aram Beecham. 05/16/2012

## 2012-05-16 NOTE — Progress Notes (Signed)
Patient seen during d/c planning group. She reports not doing well today.  She endorses off/on SI and able to contract for safety.  She is rating depression and hopelessness at eight/nine and anxiety and helplessness at seven/eight.

## 2012-05-16 NOTE — Progress Notes (Addendum)
Pt denies SI/HI/AVH. Pt rates her depression and hopelessness both as a 9. She rates her anxiety as a 5. Pt states that her anxiety has gotten better since she has taken a Xanax. She states that she has just been really down today. Support and encouragement offered. Pt receptive.

## 2012-05-17 MED ORDER — METHADONE HCL 10 MG/ML PO CONC
130.0000 mg | Freq: Every day | ORAL | Status: DC
Start: 2012-05-17 — End: 2012-05-23

## 2012-05-17 MED ORDER — SERTRALINE HCL 100 MG PO TABS: 100.0000 mg | ORAL_TABLET | Freq: Every day | ORAL | Status: DC

## 2012-05-17 MED ORDER — RAMELTEON 8 MG PO TABS: 8.0000 mg | ORAL_TABLET | Freq: Every day | ORAL | Status: DC

## 2012-05-17 MED ORDER — BUPROPION HCL ER (SR) 150 MG PO TB12: 150.0000 mg | ORAL_TABLET | Freq: Every day | ORAL | Status: DC

## 2012-05-17 MED ORDER — AMPHETAMINE-DEXTROAMPHETAMINE 30 MG PO TABS: 30.0000 mg | ORAL_TABLET | Freq: Every day | ORAL | Status: DC

## 2012-05-17 MED ORDER — QUETIAPINE FUMARATE 300 MG PO TABS: 300.0000 mg | ORAL_TABLET | Freq: Every day | ORAL | Status: DC

## 2012-05-17 NOTE — Progress Notes (Signed)
Psychoeducational Group Note  Date:  05/16/12 Time:  2053  Group Topic/Focus:  Wrap-Up Group:   The focus of this group is to help patients review their daily goal of treatment and discuss progress on daily workbooks.  Participation Level:  Minimal  Participation Quality:  Appropriate and Sharing  Affect:  Flat  Cognitive:  Appropriate  Insight:  Good  Engagement in Group:  Limited   Additional Comments:  Pt attended wrap up group this evening.  Limited responses in group.  Aundria Rud, Haeleigh Streiff L 05/17/2012, 2:11 AM

## 2012-05-17 NOTE — Progress Notes (Signed)
[facility] Group Notes:  (Counselor/Nursing/MHT/Case Management/Adjunct)  05/17/2012 5:25 PM  Type of Therapy:  Group Therapy  Participation Level:  Active  Participation Quality:  Appropriate and Attentive  Affect:  Appropriate  Cognitive:  Appropriate  Insight:  Good  Engagement in Group:  Good  Engagement in Therapy:  Good  Modes of Intervention:  Clarification, Education, Socialization and Support  Summary of Progress/Problems: Pt. Participated in counseling group on self sabotaging behaviors and identified what their behavior was and how they could enable themselves in a positive way. The pt. Spoke about withdrawing  From her boyfriends and bout the changes she wants to make after she is d/c.    [name] [place] 05/17/2012, 5:25 PM

## 2012-05-17 NOTE — Progress Notes (Signed)
Patient ID: [name], female   DOB: 14-May-1966, 46 y.o.   MRN: [id number] The patient has an anxious mood and affect. Stated that she is now taking Seroquel for her anxiety instead of Xanax. She refused her HS Seroquel because stated she already had multiple doses of Seroquel during the evening for anxiety and was afraid if she took her HS Seroquel for sleep she would get restless legs. This has happened to her in the past. Denied any suicidal ideation.

## 2012-05-17 NOTE — Progress Notes (Signed)
[facility] Group Notes:  (Counselor/Nursing/MHT/Case Management/Adjunct)  05/17/2012 10:02 AM  Type of Therapy:  After Care Planning  Group  Pt. participated in after care planning group and was given the [facility] suicide prevention information and crisis hot line numbers to call in case of emergency. Pt. agreed to [place] the numbers if needed. Pt. Spoke about having a okay day. Pt. Denied SI/HI. Pt. Reports problems sleeping  And stated when she say the doctor yesterday that medications were added.  Lamar Blinks [place] 05/17/2012, 10:02 AM
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
## 2012-05-17 NOTE — Progress Notes (Signed)
  [name] is a 46 y.o. female [id number] 1966/07/16  05/14/2012 Principal Problem:  *Major depressive disorder, recurrent episode Active Problems:  Generalized anxiety disorder  ADHD (attention deficit hyperactivity disorder)  Insomnia due to mental condition   Mental Status: Mood is better with med changes. Denies SI/HI/AVH.    Subjective/Objective: Up dressed active in [place] wants to stay until Monday am. Plans to continue methadone treatment at [facility].   Filed Vitals:   05/17/12 0731  BP: 128/81  Pulse: 72  Temp:   Resp: 18    Lab Results:   BMET    Component Value Date/Time   NA 138 05/13/2012 1130   K 3.7 05/13/2012 1130   CL 96 05/13/2012 1130   CO2 29 05/13/2012 1130   GLUCOSE 114* 05/13/2012 1130   BUN 8 05/13/2012 1130   CREATININE 0.79 05/13/2012 1130   CALCIUM 10.3 05/13/2012 1130   GFRNONAA >90 05/13/2012 1130   GFRAA >90 05/13/2012 1130    Medications:  Scheduled:     . amphetamine-dextroamphetamine  30 mg Oral Q breakfast  . buPROPion  150 mg Oral Q breakfast  . methadone  130 mg Oral Daily  . nicotine  21 mg Transdermal Daily  . QUEtiapine  150 mg Oral QHS  . ramelteon  8 mg Oral QHS  . sertraline  200 mg Oral Daily  . sodium phosphate  1 enema Rectal Once   Followed by  . sodium phosphate  1 enema Rectal BID     PRN Meds acetaminophen, alum & mag hydroxide-simeth, magnesium hydroxide, Muscle Rub, QUEtiapine, sodium phosphate, traZODone, DISCONTD: ALPRAZolam  Plan: continue current plan of care            No med changes are indicated today.  Marcela Alatorre,MICKIE D. 05/17/2012

## 2012-05-18 MED ORDER — PSEUDOEPHEDRINE HCL 30 MG PO TABS
30.0000 mg | ORAL_TABLET | ORAL | Status: DC
  Administered 2012-05-18 – 2012-05-19 (×3): 30 mg via ORAL
  Filled 2012-05-18 (×4): qty 1

## 2012-05-18 MED ORDER — LORATADINE 10 MG PO TABS
10.0000 mg | ORAL_TABLET | Freq: Every day | ORAL | Status: DC
  Administered 2012-05-18 – 2012-05-19 (×2): 10 mg via ORAL
  Filled 2012-05-18 (×4): qty 1

## 2012-05-18 MED ORDER — SALINE SPRAY 0.65 % NA SOLN
1.0000 | NASAL | Status: DC | PRN
  Filled 2012-05-18: qty 44

## 2012-05-18 NOTE — Progress Notes (Signed)
D) Pt states that she is not feeling well. Has a sore throat and has nasal congestion. States that her depression and hopelessness is a 6 and Pt denies SI and HI. Received tylenol at 1158 for a sinus headache. Attending groups. A) Given support, reassurance and praise. Sudafed, Claritin and ocean nasal spray was ordered for Patient. R) Pt denies SI and HI. States she is learning different ways to cope.

## 2012-05-18 NOTE — Progress Notes (Signed)
Patient ID: [name], female   DOB: May 26, 1966, 46 y.o.   MRN: [id number] The patient has a depressed mood and affect. She reports not sleeping well last night. Attended evening group with minimal participation. Denied any suicidal ideation. Offered no c/o pain or constipation. Refused Fleets Enema, stating she had a B.M. Yesterday.

## 2012-05-18 NOTE — Progress Notes (Signed)
  [name] is a 46 y.o. female [id number] 1966-08-28  05/14/2012 Principal Problem:  *Major depressive disorder, recurrent episode Active Problems:  Generalized anxiety disorder  ADHD (attention deficit hyperactivity disorder)  Insomnia due to mental condition   Mental Status: Mood is better denies SI/HI/AVH.    Subjective/Objective: Asks for Vicks sinus to help with sinus congestion. Is happy that she will discharge in am. Will continue with [facility] for Methadone.Not sleeping as well as she'd like but hasn't for years. Will continue work on that issue at [employer] Vitals:   05/18/12 0701  BP: 125/70  Pulse: 68  Temp:   Resp:     Lab Results:   BMET    Component Value Date/Time   NA 138 05/13/2012 1130   K 3.7 05/13/2012 1130   CL 96 05/13/2012 1130   CO2 29 05/13/2012 1130   GLUCOSE 114* 05/13/2012 1130   BUN 8 05/13/2012 1130   CREATININE 0.79 05/13/2012 1130   CALCIUM 10.3 05/13/2012 1130   GFRNONAA >90 05/13/2012 1130   GFRAA >90 05/13/2012 1130    Medications:  Scheduled:     . amphetamine-dextroamphetamine  30 mg Oral Q breakfast  . buPROPion  150 mg Oral Q breakfast  . loratadine  10 mg Oral Daily  . methadone  130 mg Oral Daily  . nicotine  21 mg Transdermal Daily  . pseudoephedrine  30 mg Oral [id number]  . QUEtiapine  150 mg Oral QHS  . ramelteon  8 mg Oral QHS  . sertraline  200 mg Oral Daily  . sodium phosphate  1 enema Rectal BID     PRN Meds acetaminophen, alum & mag hydroxide-simeth, magnesium hydroxide, Muscle Rub, QUEtiapine, sodium chloride, sodium phosphate, traZODone  Plan : Pseudephedrine is our equivalent for Vicks sinus.            Probable dischage tomorrow   Sherrick Araki,MICKIE D. 05/18/2012

## 2012-05-18 NOTE — Progress Notes (Signed)
[facility] Group Notes:  (Counselor/Nursing/MHT/Case Management/Adjunct)  05/18/2012 4:13 PM  Type of Therapy:  Group Therapy  Participation Level:  Did Not Attend    Neila Gear 05/18/2012, 4:13 PM

## 2012-05-18 NOTE — Progress Notes (Signed)
[facility] Group Notes:  (Counselor/Nursing/MHT/Case Management/Adjunct)  05/18/2012 11:36 AM  Type of Therapy:  After care Planning group  Pt. participated in after care planning group and pt. Was given [facility] Suicide Prevention pamphlet along with crisis hotline numbers. Pt. agreed to use them if needed. Pt. Was given information on free support groups and information about the [facility] located in [place], [place]. Pt. Spoke about not getting any sleep last night and felt her energy level was at a 1. Pt. Saw the doctor yesturday and stated that Welbutin was added to her Zoloft medication that she already takes, Pt. Denies SI/HI    Neila Gear 05/18/2012, 11:36 AM

## 2012-05-19 DIAGNOSIS — F909 Attention-deficit hyperactivity disorder, unspecified type: Secondary | ICD-10-CM

## 2012-05-19 DIAGNOSIS — F489 Nonpsychotic mental disorder, unspecified: Secondary | ICD-10-CM

## 2012-05-19 MED ORDER — SERTRALINE HCL 100 MG PO TABS
100.0000 mg | ORAL_TABLET | Freq: Every day | ORAL | Status: DC
  Filled 2012-05-19: qty 3

## 2012-05-19 MED ORDER — SERTRALINE HCL 100 MG PO TABS: 200.0000 mg | ORAL_TABLET | Freq: Every day | ORAL | Status: DC

## 2012-05-19 MED ORDER — BUPROPION HCL ER (SR) 150 MG PO TB12: 150.0000 mg | ORAL_TABLET | Freq: Two times a day (BID) | ORAL | Status: DC

## 2012-05-19 MED ORDER — BUPROPION HCL ER (SR) 150 MG PO TB12
150.0000 mg | ORAL_TABLET | Freq: Two times a day (BID) | ORAL | Status: DC
  Filled 2012-05-19 (×2): qty 1

## 2012-05-19 MED ORDER — QUETIAPINE FUMARATE 300 MG PO TABS
300.0000 mg | ORAL_TABLET | Freq: Every day | ORAL | Status: DC
  Filled 2012-05-19: qty 3

## 2012-05-19 NOTE — Progress Notes (Signed)
Patient ID: [name], female   DOB: Jan 03, 1966, 46 y.o.   MRN: [id number] The patient has a blunted affect and depressed mood. Spent most of the evening in bed. Did not participate in the evening wrap up group. Stated she was too tire to attend. Concerned that she has not been sleeping well. C/O having a stuffy nose and had a sinus headache earlier in the evening. Looking forward to discharge tomorrow. Denies any suicidal ideation. Will follow up at [facility].

## 2012-05-19 NOTE — Tx Team (Signed)
Interdisciplinary Treatment Plan Update (Adult)  Date:  05/19/2012  Time Reviewed:  11:06 AM   Progress in Treatment: Attending groups:   Yes   Participating in groups:  Yes Taking medication as prescribed:  Yes Tolerating medication:  Yes Family/Significant othe contact made:  Patient understands diagnosis:  Yes Discussing patient identified problems/goals with staff: Yes Medical problems stabilized or resolved: Yes Denies suicidal/homicidal ideation:Yes Issues/concerns per patient self-inventory:  Other:  New problem(s) identified:  Reason for Continuation of Hospitalization:  Interventions implemented related to continuation of hospitalization:   Additional comments:  Estimated length of stay: Discharge home today with follow up at [facility]  Discharge Plan:  New goal(s):  Review of initial/current patient goals per problem list:    1.  Goal(s): Eliminate SI/other thoughts of self harm   Met:  Yes  Target date: d/c  As evidenced by: Patient no longer endorses SI/other thought self harm    2.  Goal (s): Reduce depression/anxiety (currently rates both at four)  Met:  Yes  Target date: d/c  As evidenced by: Patient currently rating symptoms at four or below    3.  Goal(s): .stabilize on meds   Met:  Yes  Target date: d/c  As evidenced by: Patient reporst being stable on medications - symptoms have decreased    4.  Goal(s):  Assist with outpatient follow up  Met:  Yes  Target date: d/c  As evidenced by: .Follow up appointment scheduled    Attendees: Patient:  [name] 05/19/2012 11:06 AM  Nursing:  Alease Frame, RN 05/19/2012 11:07 AM  Physician:  Orson Aloe, MD 05/19/2012 11:06 AM   Nursing:   Rodman Key, RN 05/19/2012 11:06 AM   CaseManager:  Juline Patch, LCSW 05/19/2012 11:06 AM   Counselor:  Angus Palms, LCSW 05/19/2012 11:06 AM   Other:  Reyes Ivan, LCSWA

## 2012-05-19 NOTE — Progress Notes (Signed)
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 Drug abuse   . Depression   . Bipolar 2 disorder   . Attention deficit hyperactivity disorder (ADHD)     ADL's:  Intact  Sleep: Poor, she did not sleep well with Rozerem over the weekend.  She asks for Remeron to be added to her regimen.  Will do that for her.  Appetite:  Fair  Suicidal Ideation:  Pt denies suicidal ideation. Homicidal Ideation:  Denies adamantly any homicidal thoughts.  Mental Status Examination/Evaluation: Objective:  Appearance: Casual  Eye Contact::  Good  Speech:  Clear and Coherent  Volume:  Normal  Mood:  Euthymic  Affect:  Congruent  Thought Process:  Coherent  Orientation:  Full  Thought Content:  WDL  Suicidal Thoughts:  No  Homicidal Thoughts:  No  Memory:  Immediate;   Fair  Judgement:  Good  Insight:  Good  Psychomotor Activity:  Normal  Concentration:  Good  Recall:  Good  Akathisia:  No  Handed:  Right  AIMS (if indicated):     Assets:  Communication Skills Desire for Improvement  Sleep:  Number of Hours: 3    ROS: Neuro: no headaches, ataxia, weakness  GI: no N/V/D/cramps, but does have chronic constipation,  Will use Fleets enema and then follow up with Colace.  MS: no weakness, muscle cramps, aches.  Vital Signs:Blood pressure 123/81, pulse 77, temperature 98.3 F (36.8 C), temperature source Oral, resp. rate 16, height 5\' 6"  (1.676 m), weight 86.637 kg (191 lb). Current Medications: Current Facility-Administered Medications  Medication Dose Route Frequency Provider Last Rate Last Dose  . acetaminophen (TYLENOL) tablet 650 mg  650 mg Oral Q6H PRN Mickie D. Adams, PA   650 mg at 05/19/12 1258  . alum & mag hydroxide-simeth (MAALOX/MYLANTA) 200-200-20 MG/5ML suspension 30 mL  30 mL Oral Q4H  PRN Mickie D. Adams, PA      . amphetamine-dextroamphetamine (ADDERALL) tablet 30 mg  30 mg Oral Q breakfast Mike Craze, MD   30 mg at 05/19/12 0856  . buPROPion [facility] SR) 12 hr tablet 150 mg  150 mg Oral Q breakfast Curlene Labrum Readling, MD   150 mg at 05/19/12 0821  . loratadine (CLARITIN) tablet 10 mg  10 mg Oral Daily Mickie D. Adams, PA   10 mg at 05/19/12 2956  . magnesium hydroxide (MILK OF MAGNESIA) suspension 30 mL  30 mL Oral Daily PRN Mickie D. Adams, PA      . methadone (DOLOPHINE) tablet 130 mg  130 mg Oral Daily Ky Barban Absher, PHARMD   130 mg at 05/19/12 0609  . Muscle Rub CREA 1 application  1 application Topical PRN Mickie D. Adams, PA      . nicotine (NICODERM CQ - dosed in mg/24 hours) patch 21 mg  21 mg Transdermal Daily Curlene Labrum Readling, MD   21 mg at 05/19/12 0800  . pseudoephedrine (SUDAFED) tablet 30 mg  30 mg Oral [id number] Mickie D. Adams, PA   30 mg at 05/19/12 0820  . QUEtiapine (SEROQUEL) tablet 150 mg  150 mg Oral QHS Curlene Labrum Readling, MD   150 mg at 05/18/12 2130  . QUEtiapine (SEROQUEL) tablet 25 mg  25 mg Oral TID PRN Mike Craze, MD   25 mg at 05/17/12  1324  . ramelteon (ROZEREM) tablet 8 mg  8 mg Oral QHS Mike Craze, MD   8 mg at 05/18/12 2129  . sertraline (ZOLOFT) tablet 200 mg  200 mg Oral Daily Verne Spurr, PA-C   200 mg at 05/19/12 4098  . sodium chloride (OCEAN) 0.65 % nasal spray 1 spray  1 spray Each Nare PRN Mickie D. Adams, PA      . sodium phosphate (FLEET) 7-19 GM/118ML enema 1 enema  1 enema Rectal BID Mike Craze, MD       Followed by  . sodium phosphate (FLEET) 7-19 GM/118ML enema 1 enema  1 enema Rectal BID PRN Mike Craze, MD   1 enema at 05/19/12 0826  . traZODone (DESYREL) tablet 100 mg  100 mg Oral QHS PRN,MR X 1 Randy D Readling, MD        Lab Results: No results found for this or any previous visit (from the past 48 hour(s)).  Physical Findings: AIMS:  , ,  ,  ,    CIWA:    COWS:     Treatment Plan  Summary: Daily contact with patient to assess and evaluate symptoms and progress in treatment Medication management Mood/anxiety less than 3/10 where 1 is the best and 10 is the worst  Plan: Mood better, but insomnia persists she requests Remeron.  This makes sense. Will do. D/C today as she has met inpatient goals.  Faustino Luecke 05/19/2012, 2:47 PM

## 2012-05-19 NOTE — Progress Notes (Signed)
Pt is D/C home. Pt denies SI/HI/AV. Pt follow-up and medications were reviewed and pt verbalized understanding. Pt pleasant and cooperative. Pt belongings were returned.   

## 2012-05-19 NOTE — Progress Notes (Signed)
Date: 05/19/2012        Time: 1145       Group Topic/Focus: Patient invited to participate in animal assisted therapy. Pets as a coping skill and responsibility were discussed.   Participation Level: Active  Participation Quality: Appropriate and Attentive  Affect: Appropriate  Cognitive: Appropriate and Oriented   Additional Comments: None    

## 2012-05-19 NOTE — Progress Notes (Signed)
[facility] Group Notes:  (Counselor/Nursing/MHT/Case Management/Adjunct) 05/19/2012  11:00am Overcoming Obstacles to Wellness   Type of Therapy:  Group Therapy  Participation Level:  Did Not Attend     [name] 05/19/2012   3:30 PM         [facility] Group Notes:  (Counselor/Nursing/MHT/Case Management/Adjunct) 05/19/2012  1:15pm Rules for Recovery   Type of Therapy:  Group Therapy  Participation Level:  Did Not Attend     [name] 05/19/2012   3:30 PM

## 2012-05-19 NOTE — Progress Notes (Signed)
Pt. States she is having a godd day. Good eye contact and cooperative. Given a fleets as pt. Stated she has not had a BM in three days which is usual for her. Pt. Rates her depression a 5/10 and hopelesssness a 4.10. When she leaves she wants tom,"stay on the right track."She is in the hopes her family will give her some money. No SI or HI and contracts for safety,.

## 2012-05-19 NOTE — [facility] Suicide Risk Assessment (Signed)
Suicide Risk Assessment  Discharge Assessment     Demographic factors:  Divorced or widowed;Caucasian;Low socioeconomic status    Current Mental Status Per Nursing Assessment::   On Admission:    At Discharge:     Current Mental Status Per Physician:  Loss Factors: Financial problems / change in socioeconomic status  Historical Factors: Prior suicide attempts;Family history of mental illness or substance abuse  Risk Reduction Factors:      Continued Clinical Symptoms:  Severe Anxiety and/or Agitation Depression:   Insomnia Previous Psychiatric Diagnoses and Treatments  Discharge Diagnoses:   AXIS I:  Generalized Anxiety Disorder, Major Depression, Recurrent severe and Insomnia due to Mental Disorder AXIS II:  Deferred AXIS III:   Past Medical History  Diagnosis Date  . Drug abuse   . Depression   . Bipolar 2 disorder   . Attention deficit hyperactivity disorder (ADHD)    AXIS IV:  other psychosocial or environmental problems AXIS V:  51-60 moderate symptoms  Cognitive Features That Contribute To Risk:  Thought constriction (tunnel vision)    Suicide Risk:  Minimal: No identifiable suicidal ideation.  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  Drug abuse    .  Depression    .  Bipolar 2 disorder    .  Attention deficit hyperactivity disorder (ADHD)     ADL's: Intact  Sleep: Poor, she did not sleep well with Rozerem over the weekend. She asks for Remeron to be added to her regimen. Will do that for her.  Appetite: Fair  Suicidal Ideation:  Pt denies suicidal ideation.  Homicidal Ideation:  Denies adamantly any homicidal thoughts.  Mental Status  Examination/Evaluation:  Objective: Appearance: Casual   Eye Contact:: Good   Speech: Clear and Coherent   Volume: Normal   Mood: Euthymic   Affect: Congruent   Thought Process: Coherent   Orientation: Full   Thought Content: WDL   Suicidal Thoughts: No   Homicidal Thoughts: No   Memory: Immediate; Fair   Judgement: Good   Insight: Good   Psychomotor Activity: Normal   Concentration: Good   Recall: Good   Akathisia: No   Handed: Right   AIMS (if indicated):   Assets: Communication Skills  Desire for Improvement   Sleep: Number of Hours: 3   ROS:  Neuro: no headaches, ataxia, weakness  GI: no N/V/D/cramps, but does have chronic constipation, Will use Fleets enema and then follow up with Colace.  MS: no weakness, muscle cramps, aches.  Vital Signs:Blood pressure 123/81, pulse 77, temperature 98.3 F (36.8 C), temperature source Oral, resp. rate 16, height 5\' 6"  (1.676 m), weight 86.637 kg (191 lb).  Current Medications:  Current Facility-Administered Medications   Medication  Dose  Route  Frequency  Provider  Last Rate  Last Dose   .  acetaminophen (TYLENOL) tablet 650 mg  650 mg  Oral  Q6H PRN  Mickie D. Adams, PA   650 mg at 05/19/12 1258   .  alum & mag hydroxide-simeth (MAALOX/MYLANTA) 200-200-20 MG/5ML suspension 30 mL  30 mL  Oral  Q4H PRN  Mickie D. Adams, PA     .  amphetamine-dextroamphetamine (ADDERALL) tablet 30 mg  30 mg  Oral  Q breakfast  Mike Craze, MD   30 mg at 05/19/12 0856   .  buPROPion [facility] SR) 12 hr tablet 150 mg  150 mg  Oral  Q breakfast  Curlene Labrum Readling, MD   150 mg at 05/19/12 0821   .  loratadine (CLARITIN) tablet 10 mg  10 mg  Oral  Daily  Mickie D. Adams, PA   10 mg at 05/19/12 4098   .  magnesium hydroxide (MILK OF MAGNESIA) suspension 30 mL  30 mL  Oral  Daily PRN  Mickie D. Adams, PA     .  methadone (DOLOPHINE) tablet 130 mg  130 mg  Oral  Daily  Ky Barban Absher, PHARMD   130 mg at 05/19/12 0609   .  Muscle Rub CREA 1 application  1  application  Topical  PRN  Mickie D. Adams, PA     .  nicotine (NICODERM CQ - dosed in mg/24 hours) patch 21 mg  21 mg  Transdermal  Daily  Curlene Labrum Readling, MD   21 mg at 05/19/12 0800   .  pseudoephedrine (SUDAFED) tablet 30 mg  30 mg  Oral  [id number]  Mickie D. Adams, PA   30 mg at 05/19/12 0820   .  QUEtiapine (SEROQUEL) tablet 150 mg  150 mg  Oral  QHS  Curlene Labrum Readling, MD   150 mg at 05/18/12 2130   .  QUEtiapine (SEROQUEL) tablet 25 mg  25 mg  Oral  TID PRN  Mike Craze, MD   25 mg at 05/17/12 1324   .  ramelteon (ROZEREM) tablet 8 mg  8 mg  Oral  QHS  Mike Craze, MD   8 mg at 05/18/12 2129   .  sertraline (ZOLOFT) tablet 200 mg  200 mg  Oral  Daily  Verne Spurr, PA-C   200 mg at 05/19/12 1191   .  sodium chloride (OCEAN) 0.65 % nasal spray 1 spray  1 spray  Each Nare  PRN  Mickie D. Adams, PA     .  sodium phosphate (FLEET) 7-19 GM/118ML enema 1 enema  1 enema  Rectal  BID  Mike Craze, MD      Followed by   .  sodium phosphate (FLEET) 7-19 GM/118ML enema 1 enema  1 enema  Rectal  BID PRN  Mike Craze, MD   1 enema at 05/19/12 0826   .  traZODone (DESYREL) tablet 100 mg  100 mg  Oral  QHS PRN,MR X 1  Randy D Readling, MD      Lab Results:  No results found for this or any previous visit (from the past 72 hour(s)).  RISK REDUCTION FACTORS: What pt has learned from hospital stay is that it was a good decision to come in to the hospital before she acted on her suicidal thoughts.  She has learned that she needs to open the lines of communication with those in her life before she gets to this point againl  Risk of self harm is elevated by her prior attempts, her anxiety, and her depression, but she has learned that she has her life to live for.  Risk of harm to others is minimal in that she has not been involved in fights or had any legal charges filed on her.  Pt seen in treatment team where she divulged the above information. The treatment team concluded that she was ready  for  discharge and had met her goals for an inpatient setting.  PLAN: Discharge home Continue Medication List  As of 05/19/2012  3:05 PM   STOP taking these medications         ALPRAZolam 0.25 MG tablet         TAKE these medications      Indication    amphetamine-dextroamphetamine 30 MG tablet   Commonly known as: ADDERALL   Take 1 tablet (30 mg total) by mouth daily. For adhd       buPROPion 150 MG 12 hr tablet   Commonly known as: WELLBUTRIN SR   Take 1 tablet (150 mg total) by mouth 2 (two) times daily. For depression. TAKE second dose BEFORE 4PM.       methadone 10 MG/ML solution   Commonly known as: DOLOPHINE   Take 13 mLs (130 mg total) by mouth daily. Given at [facility] on [address] for opiate replacement       Muscle Rub 10-15 % Crea   Apply 1 application topically as needed. Apply to legs for leg pain       QUEtiapine 300 MG tablet   Commonly known as: SEROQUEL   Take 1 tablet (300 mg total) by mouth at bedtime. For mood/sleep       ramelteon 8 MG tablet   Commonly known as: ROZEREM   Take 1 tablet (8 mg total) by mouth at bedtime. For sleep       sertraline 100 MG tablet   Commonly known as: ZOLOFT   Take 1 tablet (100 mg total) by mouth daily. For mood/depression            Follow-up recommendations:  Activities: Resume typical activities Diet: Resume typical diet Other: Follow up with outpatient provider and report any side effects to out patient prescriber.  Plan:  Mood better, but insomnia persists she requests Remeron. This makes sense. Will do.  Will also increase Wellbutrin to BID. D/C today as she has met inpatient goals.  Marianny Goris 05/19/2012, 3:04 PM

## 2012-05-23 ENCOUNTER — Emergency Department (HOSPITAL_COMMUNITY)
Admission: EM | Admit: 2012-05-23 | Discharge: 2012-05-26 | Disposition: A | Discharge status: Home / Self Care | Payer: Self-pay | Attending: Emergency Medicine | Admitting: Emergency Medicine

## 2012-05-23 ENCOUNTER — Encounter (HOSPITAL_COMMUNITY): Payer: Self-pay | Admitting: *Deleted

## 2012-05-23 DIAGNOSIS — F909 Attention-deficit hyperactivity disorder, unspecified type: Secondary | ICD-10-CM | POA: Insufficient documentation

## 2012-05-23 DIAGNOSIS — Z79899 Other long term (current) drug therapy: Secondary | ICD-10-CM | POA: Insufficient documentation

## 2012-05-23 DIAGNOSIS — F3189 Other bipolar disorder: Secondary | ICD-10-CM | POA: Insufficient documentation

## 2012-05-23 DIAGNOSIS — F17200 Nicotine dependence, unspecified, uncomplicated: Secondary | ICD-10-CM | POA: Insufficient documentation

## 2012-05-23 DIAGNOSIS — R443 Hallucinations, unspecified: Secondary | ICD-10-CM | POA: Insufficient documentation

## 2012-05-23 LAB — ETHANOL: Alcohol, Ethyl (B): <11 mg/dL (ref 0–11)

## 2012-05-23 LAB — COMPREHENSIVE METABOLIC PANEL
AST: 25 U/L (ref 0–37)
Albumin: 3.9 g/dL (ref 3.5–5.2)
BUN: 12 mg/dL (ref 6–23)
Chloride: 100 mEq/L (ref 96–112)
Creatinine, Ser: 0.94 mg/dL (ref 0.50–1.10)
Potassium: 4.1 mEq/L (ref 3.5–5.1)
Total Bilirubin: 0.1 mg/dL — ABNORMAL LOW (ref 0.3–1.2)
Total Protein: 7.8 g/dL (ref 6.0–8.3)

## 2012-05-23 LAB — CBC
MCHC: 31.9 g/dL (ref 30.0–36.0)
MCV: 84.2 fL (ref 78.0–100.0)
Platelets: 287 10*3/uL (ref 150–400)
RDW: 15.2 % (ref 11.5–15.5)
WBC: 8.1 10*3/uL (ref 4.0–10.5)

## 2012-05-23 LAB — RAPID URINE DRUG SCREEN, HOSP PERFORMED
Amphetamines: POSITIVE — AB
Benzodiazepines: POSITIVE — AB
Cocaine: NOT DETECTED

## 2012-05-23 LAB — ACETAMINOPHEN LEVEL: Acetaminophen (Tylenol), Serum: <15 ug/mL (ref 10–30)

## 2012-05-23 MED ORDER — IBUPROFEN 600 MG PO TABS: 600.0000 mg | ORAL_TABLET | Freq: Three times a day (TID) | ORAL | Status: DC | PRN

## 2012-05-23 MED ORDER — ACETAMINOPHEN 325 MG PO TABS
650.0000 mg | ORAL_TABLET | ORAL | Status: DC | PRN
  Administered 2012-05-25 – 2012-05-26 (×4): 650 mg via ORAL
  Filled 2012-05-23 (×4): qty 2

## 2012-05-23 MED ORDER — NICOTINE 21 MG/24HR TD PT24
21.0000 mg | MEDICATED_PATCH | Freq: Every day | TRANSDERMAL | Status: DC
  Administered 2012-05-23 – 2012-05-26 (×4): 21 mg via TRANSDERMAL
  Filled 2012-05-23 (×4): qty 1

## 2012-05-23 MED ORDER — ZOLPIDEM TARTRATE 5 MG PO TABS
5.0000 mg | ORAL_TABLET | Freq: Every evening | ORAL | Status: DC | PRN
  Administered 2012-05-23: 5 mg via ORAL
  Filled 2012-05-23 (×2): qty 1

## 2012-05-23 MED ORDER — LORAZEPAM 1 MG PO TABS
1.0000 mg | ORAL_TABLET | Freq: Three times a day (TID) | ORAL | Status: DC | PRN
  Administered 2012-05-23 – 2012-05-26 (×7): 1 mg via ORAL
  Filled 2012-05-23 (×7): qty 1

## 2012-05-23 MED ORDER — ONDANSETRON HCL 4 MG PO TABS: 4.0000 mg | ORAL_TABLET | Freq: Three times a day (TID) | ORAL | Status: DC | PRN

## 2012-05-23 NOTE — ED Notes (Signed)
Pt states "I was just released from [facility] on Monday, I've been talking to ppl that are not there, I called [facility] and they told me to come here so I can see about going back over there." Pt denies HI/SI. Only c/o hearing/seeing things.

## 2012-05-23 NOTE — BH Assessment (Addendum)
Assessment Note   [name] is an 46 y.o. female. Pt reported to the [facility] with a chief complaint of AVH. Pt presents as sad, crying throughout the assessment. Pt reports a long hx of depression. Pt states that she was admitted to [facility] recently for depression and was discharged on Monday 05/19/12. Pt states that when she was discharged she was negative for depressive symptoms. Pt reports that for the last 2 days she has been having auditory and visual hallucinations. Pt states that she has been "randomly talking to people who are not there". Pt states that she has been with her brother who noticed the behaviors. Pt states she was even irritable and talking to her leg at one time. Pt states "I have never experienced this before, I'm scared." Pt states "I can't sleep the past 2 nights, I wake up and rise out of the bed yelling out names". Pt states that her depression is returning though only due to this new onset of hallucinations. Pt reports that Wellbutrin was added while at [facility] though she is unsure if this is causing the hallucinations. Pt stated that she has continued with her methadone treatment for pain management and has not missed any doses of medications. Pt denies SI/HI and substance use. Pt states that she had called [facility] and was told to come to the [facility] before transferring to [facility]. At this time, pt requires inpatient hospitalization for safety and stabilization.   Axis I: Psychotic Disorder NOS Axis II: Deferred Axis III:  Past Medical History  Diagnosis Date  . Drug abuse   . Depression   . Bipolar 2 disorder   . Attention deficit hyperactivity disorder (ADHD)    Axis IV: economic problems and other psychosocial or environmental problems Axis V: 21-30 behavior considerably influenced by delusions or hallucinations OR serious impairment in judgment, communication OR inability to function in almost all areas  Past Medical History:  Past Medical History  Diagnosis Date  . Drug abuse     . Depression   . Bipolar 2 disorder   . Attention deficit hyperactivity disorder (ADHD)     Past Surgical History  Procedure Date  . Abdominal hysterectomy     Family History: History reviewed. No pertinent family history.  Social History:  reports that she has been smoking Cigarettes.  She has a 12.5 pack-year smoking history. She does not have any smokeless tobacco history on file. She reports that she does not drink alcohol or use illicit drugs.  Additional Social History:     CIWA: CIWA-Ar BP: 119/78 mmHg Pulse Rate: 76  COWS:    Allergies:  Allergies  Allergen Reactions  . Compazine Other (See Comments)    Makes her "panicky"  . Penicillins Rash    Home Medications:  (Not in a hospital admission)  OB/GYN Status:  No LMP recorded. Patient has had a hysterectomy.  General Assessment Data Location of Assessment: [facility] ED Living Arrangements: Spouse/significant other Can pt return to current living arrangement?: Yes Admission Status: Voluntary Is patient capable of signing voluntary admission?: Yes Transfer from: Acute Hospital Referral Source: Self/Family/Friend  Education Status Is patient currently in school?: No  Risk to self Suicidal Ideation: No Suicidal Intent: No Is patient at risk for suicide?: No Suicidal Plan?: No Access to Means: No What has been your use of drugs/alcohol within the last 12 months?: pt denies use Previous Attempts/Gestures: Yes How many times?: 3  ((most recent attempt in approx. 2005)) Other Self Harm Risks: pt  denies Triggers for Past Attempts: Other (Comment) (depression-mother's death) Intentional Self Injurious Behavior: None Family Suicide History: No Recent stressful life event(s): Financial Problems Persecutory voices/beliefs?: Yes Depression: Yes Depression Symptoms: Insomnia;Tearfulness;Fatigue Substance abuse history and/or treatment for substance abuse?: No Suicide prevention information given to non-admitted  patients: Not applicable  Risk to Others Homicidal Ideation: No Thoughts of Harm to Others: No Current Homicidal Intent: No Current Homicidal Plan: No Access to Homicidal Means: No Identified Victim: none History of harm to others?: No Assessment of Violence: None Noted Violent Behavior Description: pt was tearful but cooperative Does patient have access to weapons?: No Criminal Charges Pending?: No Does patient have a court date: No  Psychosis Hallucinations: Auditory;Visual Delusions: Unspecified  Mental Status Report Appear/Hygiene: Disheveled Eye Contact: Fair Motor Activity: Freedom of movement Speech: Logical/coherent;Soft Level of Consciousness: Crying Mood: Depressed;Sad;Helpless Affect: Depressed;Sad Anxiety Level: Moderate Thought Processes: Coherent;Relevant Judgement: Unimpaired Orientation: Person;Place;Time;Situation Obsessive Compulsive Thoughts/Behaviors: None  Cognitive Functioning Concentration: Normal Memory: Recent Intact;Remote Intact IQ: Average Insight: Fair Impulse Control: Fair Appetite: Fair Weight Loss: 0  Weight Gain: 0  Sleep: Decreased Total Hours of Sleep: 2  (nightly for the past few nights) Vegetative Symptoms: None  ADLScreening [facility] Assessment Services) Patient's cognitive ability adequate to safely complete daily activities?: Yes Patient able to express need for assistance with ADLs?: Yes Independently performs ADLs?: Yes  Abuse/Neglect [facility]) Physical Abuse: Denies Verbal Abuse: Denies Sexual Abuse: Denies  Prior Inpatient Therapy Prior Inpatient Therapy: Yes Prior Therapy Dates: 05/2012; hospitalizations greater than 3 years ago at [facility] Prior Therapy Facilty/Provider(s): [facility] Reason for Treatment: SI/Depression  Prior Outpatient Therapy Prior Outpatient Therapy: Yes Prior Therapy Dates: Current Prior Therapy Facilty/Provider(s): [facility] Reason for Treatment: medication management; methadone for pain  management  ADL Screening (condition at time of admission) Patient's cognitive ability adequate to safely complete daily activities?: Yes Patient able to express need for assistance with ADLs?: Yes Independently performs ADLs?: Yes       Abuse/Neglect Assessment (Assessment to be complete while patient is alone) Physical Abuse: Denies Verbal Abuse: Denies Sexual Abuse: Denies Values / Beliefs Cultural Requests During Hospitalization: None Spiritual Requests During Hospitalization: None        Additional Information 1:1 In Past 12 Months?: No CIRT Risk: No Elopement Risk: No Does patient have medical clearance?: Yes     Disposition:  Disposition Disposition of Patient: Inpatient treatment program Type of inpatient treatment program: Adult Patient referred to:  [facility])  On Site Evaluation by:   Reviewed with Physician:     [place] Crane F 05/23/2012 10:25 PM

## 2012-05-23 NOTE — ED Provider Notes (Signed)
History     CSN: [id number]  Arrival date & time 05/23/12  1417   First MD Initiated Contact with Patient 05/23/12 1511      Chief Complaint  Patient presents with  . Hallucinations    (Consider location/radiation/quality/duration/timing/severity/associated sxs/prior treatment) HPI Comments: Pt comes in with chief complaint of depression/hallucinations.  Pt has a hx of bipolar d/o and was recently admitted to [facility] for depression, was discharged on Monday.  Presents today with complaints of hallucinations.   Pt says that she is talking to people who are not there.  Called [facility] and was told to come over here.  Denies SI.  Denies any physical complaints.  Denies substance abuse.  States that the hallucinations started last night and became worse today  The history is provided by the patient.    Past Medical History  Diagnosis Date  . Drug abuse   . Depression   . Bipolar 2 disorder   . Attention deficit hyperactivity disorder (ADHD)     Past Surgical History  Procedure Date  . Abdominal hysterectomy     History reviewed. No pertinent family history.  History  Substance Use Topics  . Smoking status: Current Everyday Smoker -- 0.5 packs/day for 25 years    Types: Cigarettes  . Smokeless tobacco: Not on file  . Alcohol Use: No    OB History    Grav Para Term Preterm Abortions TAB SAB Ect Mult Living                  Review of Systems  Constitutional: Negative for fever, chills, diaphoresis and fatigue.  HENT: Negative for congestion, rhinorrhea and sneezing.   Eyes: Negative.   Respiratory: Negative for cough, chest tightness and shortness of breath.   Cardiovascular: Negative for chest pain and leg swelling.  Gastrointestinal: Negative for nausea, vomiting, abdominal pain, diarrhea and blood in stool.  Genitourinary: Negative for frequency, hematuria, flank pain and difficulty urinating.  Musculoskeletal: Negative for back pain and arthralgias.  Skin: Negative for  rash.  Neurological: Negative for dizziness, speech difficulty, weakness, numbness and headaches.  Psychiatric/Behavioral: Positive for hallucinations and disturbed wake/sleep cycle. Negative for suicidal ideas and confusion. The patient is nervous/anxious.     Allergies  Compazine and Penicillins  Home Medications   Current Outpatient Rx  Name Route Sig Dispense Refill  . AMPHETAMINE-DEXTROAMPHETAMINE 30 MG PO TABS Oral Take 30 mg by mouth daily.    . BUPROPION HCL ER (SR) 150 MG PO TB12 Oral Take 150 mg by mouth 2 (two) times daily.    Marland Kitchen METHADONE HCL 10 MG/ML PO CONC Oral Take 130 mg by mouth daily.    . QUETIAPINE FUMARATE 300 MG PO TABS Oral Take 300 mg by mouth at bedtime.    Marland Kitchen RAMELTEON 8 MG PO TABS Oral Take 8 mg by mouth at bedtime.    . SERTRALINE HCL 100 MG PO TABS Oral Take 200 mg by mouth daily.      BP 119/78  Pulse 76  Temp 98.2 F (36.8 C) (Oral)  Resp 20  SpO2 95%  Physical Exam  Constitutional: She is oriented to person, place, and time. She appears well-developed and well-nourished.  HENT:  Head: Normocephalic and atraumatic.  Eyes: Pupils are equal, round, and reactive to light.  Neck: Normal range of motion. Neck supple.  Cardiovascular: Normal rate, regular rhythm and normal heart sounds.   Pulmonary/Chest: Effort normal and breath sounds normal. No respiratory distress. She has no wheezes. She  has no rales. She exhibits no tenderness.  Abdominal: Soft. Bowel sounds are normal. There is no tenderness. There is no rebound and no guarding.  Musculoskeletal: Normal range of motion. She exhibits no edema.  Lymphadenopathy:    She has no cervical adenopathy.  Neurological: She is alert and oriented to person, place, and time.  Skin: Skin is warm and dry. No rash noted.  Psychiatric: She has a normal mood and affect.    ED Course  Procedures (including critical care time)  Results for orders placed during the hospital encounter of 05/23/12  CBC       Component Value Range   WBC 8.1  4.0 - 10.5 K/uL   RBC 4.55  3.87 - 5.11 MIL/uL   Hemoglobin 12.2  12.0 - 15.0 g/dL   HCT 56.2  13.0 - 86.5 %   MCV 84.2  78.0 - 100.0 fL   MCH 26.8  26.0 - 34.0 pg   MCHC 31.9  30.0 - 36.0 g/dL   RDW 78.4  69.6 - 29.5 %   Platelets 287  150 - 400 K/uL  COMPREHENSIVE METABOLIC PANEL      Component Value Range   Sodium 139  135 - 145 mEq/L   Potassium 4.1  3.5 - 5.1 mEq/L   Chloride 100  96 - 112 mEq/L   CO2 30  19 - 32 mEq/L   Glucose, Bld 93  70 - 99 mg/dL   BUN 12  6 - 23 mg/dL   Creatinine, Ser 2.84  0.50 - 1.10 mg/dL   Calcium 9.7  8.4 - 13.2 mg/dL   Total Protein 7.8  6.0 - 8.3 g/dL   Albumin 3.9  3.5 - 5.2 g/dL   AST 25  0 - 37 U/L   ALT 24  0 - 35 U/L   Alkaline Phosphatase 108  39 - 117 U/L   Total Bilirubin 0.1 (*) 0.3 - 1.2 mg/dL   GFR calc non Af Amer 72 (*) >90 mL/min   GFR calc Af Amer 84 (*) >90 mL/min  ETHANOL      Component Value Range   Alcohol, Ethyl (B) <11  0 - 11 mg/dL  ACETAMINOPHEN LEVEL      Component Value Range   Acetaminophen (Tylenol), Serum <15.0  10 - 30 ug/mL  URINE RAPID DRUG SCREEN (HOSP PERFORMED)      Component Value Range   Opiates NONE DETECTED  NONE DETECTED   Cocaine NONE DETECTED  NONE DETECTED   Benzodiazepines POSITIVE (*) NONE DETECTED   Amphetamines POSITIVE (*) NONE DETECTED   Tetrahydrocannabinol NONE DETECTED  NONE DETECTED   Barbiturates NONE DETECTED  NONE DETECTED   No results found.    1. Hallucinations       MDM  Pt awaiting assessment by ACT        Rolan Bucco, MD 05/23/12 2332

## 2012-05-24 MED ORDER — SERTRALINE HCL 50 MG PO TABS
200.0000 mg | ORAL_TABLET | Freq: Every day | ORAL | Status: DC
  Administered 2012-05-24 – 2012-05-26 (×3): 200 mg via ORAL
  Filled 2012-05-24: qty 4
  Filled 2012-05-24: qty 3
  Filled 2012-05-24: qty 4

## 2012-05-24 MED ORDER — AMPHETAMINE-DEXTROAMPHETAMINE 10 MG PO TABS
30.0000 mg | ORAL_TABLET | Freq: Every day | ORAL | Status: DC
  Administered 2012-05-24 – 2012-05-26 (×3): 30 mg via ORAL
  Filled 2012-05-24 (×4): qty 3

## 2012-05-24 MED ORDER — BUPROPION HCL ER (SR) 150 MG PO TB12
150.0000 mg | ORAL_TABLET | Freq: Two times a day (BID) | ORAL | Status: DC
  Administered 2012-05-24 – 2012-05-26 (×5): 150 mg via ORAL
  Filled 2012-05-24 (×6): qty 1

## 2012-05-24 MED ORDER — METHADONE HCL 10 MG/ML PO CONC
130.0000 mg | Freq: Every day | ORAL | Status: DC
  Administered 2012-05-24 – 2012-05-26 (×3): 130 mg via ORAL
  Filled 2012-05-24: qty 1
  Filled 2012-05-24 (×3): qty 13

## 2012-05-24 MED ORDER — QUETIAPINE FUMARATE 25 MG PO TABS
25.0000 mg | ORAL_TABLET | Freq: Every day | ORAL | Status: DC
  Administered 2012-05-24 – 2012-05-25 (×2): 25 mg via ORAL
  Filled 2012-05-24 (×2): qty 1

## 2012-05-24 NOTE — ED Notes (Signed)
Pt sitting on bed, watching tv, c/o increased anxiety, sweating, requesting ativan.  Reassured, cool drink offered, room temp decreased

## 2012-05-24 NOTE — ED Notes (Signed)
This Clinical research associate verified [facility] that the patient is on 130mg  of methadone and dosed yesterday

## 2012-05-24 NOTE — ED Notes (Signed)
Up tot he bathroom to shower and change scrubs 

## 2012-05-24 NOTE — ED Notes (Signed)
Drawing in room 

## 2012-05-24 NOTE — ED Notes (Signed)
Up to the bathroom 

## 2012-05-24 NOTE — BH Assessment (Signed)
Assessment Note Excerpts from pt's initial assessment 05/23/12 - Pt reported to the [facility] with a chief complaint of AVH. Pt presents as sad, crying throughout the assessment. Pt reports a long hx of depression. Pt states that she was admitted to [facility] recently for depression and was discharged on Monday 05/19/12. Pt states that when she was discharged she was negative for depressive symptoms. Pt reports that for the last 2 days she has been having auditory and visual hallucinations. Pt states that she has been "randomly talking to people who are not there". Pt states that she has been with her brother who noticed the behaviors. Pt states she was even irritable and talking to her leg at one time. Pt states "I have never experienced this before, I'm scared." Pt states "I can't sleep the past 2 nights, I wake up and rise out of the bed yelling out names". Pt states that her depression is returning though only due to this new onset of hallucinations. Pt reports that Wellbutrin was added while at [facility] though she is unsure if this is causing the hallucinations. Pt stated that she has continued with her methadone treatment for pain management and has not missed any doses of medications. Pt denies SI/HI and substance use. Pt states that she had called [facility] and was told to come to the [facility] before transferring to [facility].   Reassessment 05/24/12 - Pt reports mood as "down". She cries during assessment. Pt endorses AVH. She reports hearing voices today telling her, "You're stupid. You're dumb". She says she saw her daughter in her room when daughter wasn't actually present. Pt endorses moderate anxiety due to AVH. Pt states depressive symptoms and AVH began 05/22/12. She denies SI and HI. No delusions noted. Pt reports being scared b/c she has never experienced AVH previously.  Axis I: Psychotic Disorder NOS Axis II: Deferred Axis III:  Past Medical History  Diagnosis Date  . Drug abuse   . Depression   . Bipolar 2 disorder     . Attention deficit hyperactivity disorder (ADHD)    Axis IV: economic problems Axis V: 31-40 impairment in reality testing  Past Medical History:  Past Medical History  Diagnosis Date  . Drug abuse   . Depression   . Bipolar 2 disorder   . Attention deficit hyperactivity disorder (ADHD)     Past Surgical History  Procedure Date  . Abdominal hysterectomy     Family History: History reviewed. No pertinent family history.  Social History:  reports that she has been smoking Cigarettes.  She has a 12.5 pack-year smoking history. She does not have any smokeless tobacco history on file. She reports that she does not drink alcohol or use illicit drugs.  Additional Social History:  Alcohol / Drug Use Pain Medications: denies abusing methadone for chronic pain Prescriptions: denies abuse Over the Counter: n/a History of alcohol / drug use?: No history of alcohol / drug abuse Longest period of sobriety (when/how long): n/a  CIWA: CIWA-Ar BP: 115/76 mmHg Pulse Rate: 76  COWS:    Allergies:  Allergies  Allergen Reactions  . Compazine Other (See Comments)    Makes her "panicky"  . Penicillins Rash    Home Medications:  (Not in a hospital admission)  OB/GYN Status:  No LMP recorded. Patient has had a hysterectomy.  General Assessment Data Location of Assessment: [facility] ED Living Arrangements: Spouse/significant other Can pt return to current living arrangement?: Yes Admission Status: Voluntary Is patient capable of signing voluntary admission?:  Yes Transfer from: Acute Hospital Referral Source: Self/Family/Friend  Education Status Is patient currently in school?: No Current Grade: n/a Highest grade of school patient has completed: [employer] grad, phlebotomy training at [employer] Name of school: [employer] in [employer] person: n/a  Risk to self Suicidal Ideation: No Suicidal Intent: No Is patient at risk for suicide?: No Suicidal Plan?: No Access to Means:  No What has been your use of drugs/alcohol within the last 12 months?: pt denies substance use Previous Attempts/Gestures: Yes How many times?: 3  (most recent attempt approx 2005) Other Self Harm Risks: n/a Triggers for Past Attempts: Other (Comment) (depression/mother's death) Intentional Self Injurious Behavior: None Family Suicide History: No Recent stressful life event(s): Financial Problems Persecutory voices/beliefs?: Yes Depression: Yes Depression Symptoms: Insomnia;Tearfulness;Fatigue Substance abuse history and/or treatment for substance abuse?: No Suicide prevention information given to non-admitted patients: Not applicable  Risk to Others Homicidal Ideation: No Thoughts of Harm to Others: No Current Homicidal Intent: No Current Homicidal Plan: No Access to Homicidal Means: No Identified Victim: n/a History of harm to others?: No Assessment of Violence: None Noted Violent Behavior Description: n/a Does patient have access to weapons?: No Criminal Charges Pending?: No Does patient have a court date: No  Psychosis Hallucinations: Auditory;Visual Delusions: None noted  Mental Status Report Appear/Hygiene: Other (Comment) (unremarkable) Eye Contact: Fair Motor Activity: Freedom of movement Speech: Logical/coherent Level of Consciousness: Crying;Quiet/awake Mood: Depressed;Sad Affect: Depressed;Sad Anxiety Level: Moderate Thought Processes: Relevant;Coherent Judgement: Unimpaired Orientation: Person;Place;Time;Situation Obsessive Compulsive Thoughts/Behaviors: None  Cognitive Functioning Concentration: Normal Memory: Recent Intact;Remote Intact IQ: Average Insight: Fair Impulse Control: Fair Appetite: Fair Weight Loss: 0  Weight Gain: 0  Sleep: Decreased Total Hours of Sleep: 2  (2 hrs nightly for past few nights) Vegetative Symptoms: None  ADLScreening [facility]) Patient's cognitive ability adequate to safely complete daily  activities?: Yes Patient able to express need for assistance with ADLs?: Yes Independently performs ADLs?: Yes  Abuse/Neglect [facility]) Physical Abuse: Denies Verbal Abuse: Denies Sexual Abuse: Denies  Prior Inpatient Therapy Prior Inpatient Therapy: Yes Prior Therapy Dates: 05/2012; hospitalizations greater than 3 years ago at [facility] Prior Therapy Facilty/Provider(s): [facility] Reason for Treatment: SI/Depression  Prior Outpatient Therapy Prior Outpatient Therapy: Yes Prior Therapy Dates: Current Prior Therapy Facilty/Provider(s): [facility] Reason for Treatment: medication management; methadone for pain management  ADL Screening (condition at time of admission) Patient's cognitive ability adequate to safely complete daily activities?: Yes Patient able to express need for assistance with ADLs?: Yes Independently performs ADLs?: Yes       Abuse/Neglect Assessment (Assessment to be complete while patient is alone) Physical Abuse: Denies Verbal Abuse: Denies Sexual Abuse: Denies Values / Beliefs Cultural Requests During Hospitalization: None Spiritual Requests During Hospitalization: None   Advance Directives (For Healthcare) Advance Directive: Patient does not have advance directive;Patient would not like information    Additional Information 1:1 In Past 12 Months?: No CIRT Risk: No Elopement Risk: No Does patient have medical clearance?: Yes     Disposition:  Disposition Disposition of Patient: Inpatient treatment program Type of inpatient treatment program: Adult Patient referred to:  [facility])  On Site Evaluation by:   Reviewed with Physician:     Donnamarie Rossetti P 05/24/2012 10:29 PM

## 2012-05-24 NOTE — ED Notes (Signed)
Sitting quietly on the bed, tearful.  Pt reports that the voices are back "telling me I'm stupid".  Pt encouraged to use distraction/TV to help quiet the voices.  Reassured, encouraged to verbalize her fears.

## 2012-05-24 NOTE — ED Notes (Signed)
Word search/coloring book given

## 2012-05-25 NOTE — ED Notes (Signed)
C/o of HA-medicated

## 2012-05-25 NOTE — ED Notes (Signed)
Up to the bathroom 

## 2012-05-25 NOTE — ED Notes (Signed)
Calmer, snack given, watching tv

## 2012-05-25 NOTE — ED Notes (Signed)
Up tot he bathroom to shower and change scrubs 

## 2012-05-25 NOTE — ED Provider Notes (Signed)
BP 118/75  Pulse 60  Temp 98.5 F (36.9 C) (Oral)  Resp 18  SpO2 95%  No new issues overnight. Continues to hallucinate. Pending placement.   Loren Racer, MD 05/25/12 [phone]

## 2012-05-25 NOTE — ED Notes (Signed)
Linens changed.

## 2012-05-26 DIAGNOSIS — F329 Major depressive disorder, single episode, unspecified: Secondary | ICD-10-CM

## 2012-05-26 DIAGNOSIS — F29 Unspecified psychosis not due to a substance or known physiological condition: Secondary | ICD-10-CM

## 2012-05-26 DIAGNOSIS — F909 Attention-deficit hyperactivity disorder, unspecified type: Secondary | ICD-10-CM

## 2012-05-26 NOTE — ED Provider Notes (Signed)
Pt with hallucinations. Hx same. telepsych has recommended inpatient psych tx. Discussed w act team, awaiting placement. Vitals normal. Pt comfortable, nad.   Suzi Roots, MD 05/26/12 [phone]

## 2012-05-26 NOTE — Discharge Instructions (Signed)
Followup with Dr. Ladona Ridgel at [facility]. If you feel that you may be in danger for any reason or feel that he may harm herself or others call 911 immediately

## 2012-05-26 NOTE — ED Notes (Signed)
Pain assessmt/medication given at 1208.

## 2012-05-26 NOTE — BHH Counselor (Signed)
Per EDP notes, shift report, and assessment from nurse/BHH/SWW pt with current hallucinations and history. Telepsych completed and recommended inpatient psych tx. EDP has discussed this with BHH/SW. Pt is awaiting placement.   Pending referrals to [facility] for inpatient treatment.

## 2012-05-26 NOTE — BHH Counselor (Signed)
Patient evaluated by Dr. Elsie Saas and he recommends outpatient psychiatric services at [facility] and patient see Dr. Ladona Ridgel for the medication management. Writer informed EDP-Dr. Adonis Huguenin and he agreed to discharge patient accordingly. Writer provided patient with referral information to [facility] (address/phone number/etc). Patient also given additional information to support groups, mobile crises, and local therapist in the community.

## 2012-05-26 NOTE — ED Provider Notes (Signed)
6:20 PM patient alert Glasgow Coma Score 15 denies point to harm herself. Seen by Dr.Johnalagada here today. Recommendations for followup at [facility] as outpatient no change in medication recommended presently. Patient is a alert pleasant cooperative at present  Doug Sou, MD 05/26/12 [phone]

## 2012-05-26 NOTE — Consult Note (Signed)
Reason for Consult: Psychosis Referring Physician: Dr. Pleas Patricia is an 46 y.o. female.  HPI: Patient was known to this provider from her previous the [facility] long emergency department Psychiatric assessment. Patient was known with the multiple psychiatric and medical conditions. Patient has been diagnosed with attention deficit hyperactivity disorder, bipolar 2 disorder, depression, drug of abuse with opiates. Patient the presented with the symptoms off for depression and the psychotic symptoms, especially auditory and the visual hallucinations. Patient reported she has been feeling better today since he has been able to rest and sleep well. Patient thought she wasn't hallucinating, because she could not sleep and now and his the he does of sleep deprivation. Patient denied current symptoms of for hallucinations, delusions, paranoia. Patient denies auditory or visual hallucinations. Patient denies current suicidal or homicidal ideation, intentions or plan. Patient is willing to followup with outpatient psychiatric services and taking medication management as recommended. Patient has no abnormal urine drug screen or blood alcohol level. Patient the amphetamines and benzodiazepines are positive, because of the therapeutic. Medications. She has been taking Adderall, and the Ativan.  Patient appeared sitting in her bed without abnormal psychomotor activity. She has stated mood was good and her affect was appropriate bright and full. Date. She has normal rate, rhythm, and volume of speech. Patient has linear and goal-directed thoughts. Patient has no suicidal or homicidal ideations, or psychosis. Patient has fair insight, judgment and impulse control.  Past Medical History  Diagnosis Date  . Drug abuse   . Depression   . Bipolar 2 disorder   . Attention deficit hyperactivity disorder (ADHD)     Past Surgical History  Procedure Date  . Abdominal hysterectomy     History reviewed. No  pertinent family history.  Social History:  reports that she has been smoking Cigarettes.  She has a 12.5 pack-year smoking history. She does not have any smokeless tobacco history on file. She reports that she does not drink alcohol or use illicit drugs.  Allergies:  Allergies  Allergen Reactions  . Compazine Other (See Comments)    Makes her "panicky"  . Penicillins Rash    Medications: I have reviewed the patient's current medications.  No results found for this or any previous visit (from the past 48 hour(s)).  No results found.  No depression, No anxiety, No psychosis and Positive for ADHD, bipolar, illegal drug usage and sleep disturbance Blood pressure 125/82, pulse 88, temperature 98.4 F (36.9 C), temperature source Oral, resp. rate 16, SpO2 96.00%.   Assessment/Plan: Maj. depressive disorder, with the Psychosis. Attention deficit hyperactivity disorder. Chronic pain disorder  Recommended outpatient psychiatric services at [facility] and patient see Dr. Ladona Ridgel for the medication management. No medication changes recommended at this time of discharge.  Albie Arizpe,JANARDHAHA R. 05/26/2012, 5:55 PM

## 2012-05-28 NOTE — Discharge Summary (Signed)
Physician Discharge Summary Note  Patient:  [name] is an 46 y.o., female MRN:  [id number] DOB:  15-Jun-1966 Patient phone:  [phone] (home)  Patient address:   [address] [place] [address],   Date of Admission:  05/14/2012 Date of Discharge: 05/19/2012  Discharge Diagnoses: Principal Problem:  *Major depressive disorder, recurrent episode Active Problems:  Generalized anxiety disorder  ADHD (attention deficit hyperactivity disorder)  Insomnia due to mental condition   Axis Diagnosis:   AXIS I:  Generalized Anxiety Disorder, Major Depression, Recurrent severe and History of ADHD, Insomnia due to mental disorder, Opiate use for pain management, likely complicating her sleep wake cycle disturbance AXIS II:  Deferred AXIS III:   Past Medical History  Diagnosis Date  . Drug abuse   . Depression   . Bipolar 2 disorder   . Attention deficit hyperactivity disorder (ADHD)    AXIS IV:  other psychosocial or environmental problems AXIS V:  51-60 moderate symptoms  Level of Care:  OP  Hospital Course:   This is a voluntary admission for [name] who goes by "[name]". She presented to the ED at [facility] 2 days ago reporting that she had increased thoughts of self harm and was concerned for her safety as she has had 3 previous attempts at sucide. [name] reports that she stopped all of her medication several days ago as she had no transportation to go to the clinic and could not afford her medications anyway. She is followed by Dr. Ladona Ridgel at [facility] and goes to the [facility] for her methadone. She has been a patient at the pain clinic for 4 years without difficulty. 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 Prior to admissions she notes that she had poor sleep, poor appetite, rates her depression as a 9/10, and had thoughts of death and suicide. She notes that she feels safe here on the unit, but was alone a great deal of the time and  that is when she felt unsafe. She reports her anxiety level is a 4/10, and has no AH/VH. Currently she has rated her feelings of hopelessness as 9/10.   While a patient in this hospital, [name] received medication management for her MDD, GAD, and insomnia. They were ordered to stop Xanax for the disinhibition and cloudy thinking it causes and instead received low dose Seroquel for her anxiety and high dose Seroquel with Rozerem and Remeron for insomnia as well as Wellbutrin and Zoloft for depression. They were also enrolled in group counseling sessions and activities in which they participated actively.   Patient attended treatment team meeting this am and met with treatment team members. Pt symptoms, treatment plan and response to treatment discussed. [name] endorsed that their symptoms have improved. Pt also stated that they are stable for discharge. In other to maintain her sleep cycle and mood control, they will continue psychiatric care on outpatient basis. They will follow-up at [facility] with Dr Ladona Ridgel on 6/20 @ 0800.  Upon discharge, patient adamantly denies suicidal, homicidal ideations, auditory, visual hallucinations and or delusional thinking. They left [facility] with all personal belongings in no apparent distress.  Consults:  None  Significant Diagnostic Studies:  labs: UDS poistive for opiates and benzodiazepines (stopped for the disinhibition and cloudy thinking)  Discharge Vitals:   Blood pressure 123/81, pulse 77, temperature 98.3 F (36.8 C), temperature source Oral, resp. rate 16, height 5\' 6"  (1.676 m), weight 86.637 kg (191 lb).  Mental  Status Exam: See Mental Status Examination and Suicide Risk Assessment completed by Attending Physician prior to discharge.  Discharge destination:  Home  Is patient on multiple antipsychotic therapies at discharge:  No   Has Patient had three or more failed trials of antipsychotic monotherapy by history: N/A Recommended Plan for Multiple  Antipsychotic Therapies: N/A  Medication List  As of 05/28/2012  7:54 AM   STOP taking these medications         ALPRAZolam 0.25 MG tablet      amphetamine-dextroamphetamine 30 MG tablet      methadone 10 MG/ML solution      QUEtiapine 300 MG tablet      sertraline 100 MG tablet           Follow-up Information    Follow up with  Dr. Diona Browner on 05/22/2012. (You are schedued to see Dr. Ladona Ridgel at  [facility] on Thursday, May 22, 2012)    Contact information:   [address] [place], [place]  [address]  [phone]         Follow-up recommendations:   Activities: Resume typical activities Diet: Resume typical diet Other: Follow up with outpatient provider and report any side effects to out patient prescriber.  SignedDan Humphreys, Kainen Struckman 05/28/2012, 7:54 AM

## 2012-10-22 ENCOUNTER — Emergency Department (HOSPITAL_BASED_OUTPATIENT_CLINIC_OR_DEPARTMENT_OTHER)
Admission: EM | Admit: 2012-10-22 | Discharge: 2012-10-22 | Disposition: A | Discharge status: Home / Self Care | Payer: Self-pay | Attending: Emergency Medicine | Admitting: Emergency Medicine

## 2012-10-22 ENCOUNTER — Encounter (HOSPITAL_BASED_OUTPATIENT_CLINIC_OR_DEPARTMENT_OTHER): Payer: Self-pay | Admitting: Emergency Medicine

## 2012-10-22 ENCOUNTER — Emergency Department (HOSPITAL_BASED_OUTPATIENT_CLINIC_OR_DEPARTMENT_OTHER): Payer: Self-pay

## 2012-10-22 DIAGNOSIS — R350 Frequency of micturition: Secondary | ICD-10-CM | POA: Insufficient documentation

## 2012-10-22 DIAGNOSIS — N390 Urinary tract infection, site not specified: Secondary | ICD-10-CM | POA: Insufficient documentation

## 2012-10-22 DIAGNOSIS — R110 Nausea: Secondary | ICD-10-CM | POA: Insufficient documentation

## 2012-10-22 DIAGNOSIS — F3189 Other bipolar disorder: Secondary | ICD-10-CM | POA: Insufficient documentation

## 2012-10-22 DIAGNOSIS — F17200 Nicotine dependence, unspecified, uncomplicated: Secondary | ICD-10-CM | POA: Insufficient documentation

## 2012-10-22 DIAGNOSIS — N200 Calculus of kidney: Secondary | ICD-10-CM | POA: Insufficient documentation

## 2012-10-22 DIAGNOSIS — F329 Major depressive disorder, single episode, unspecified: Secondary | ICD-10-CM | POA: Insufficient documentation

## 2012-10-22 DIAGNOSIS — F1110 Opioid abuse, uncomplicated: Secondary | ICD-10-CM | POA: Insufficient documentation

## 2012-10-22 DIAGNOSIS — F909 Attention-deficit hyperactivity disorder, unspecified type: Secondary | ICD-10-CM | POA: Insufficient documentation

## 2012-10-22 DIAGNOSIS — F3289 Other specified depressive episodes: Secondary | ICD-10-CM | POA: Insufficient documentation

## 2012-10-22 DIAGNOSIS — Z79899 Other long term (current) drug therapy: Secondary | ICD-10-CM | POA: Insufficient documentation

## 2012-10-22 LAB — CBC WITH DIFFERENTIAL/PLATELET
Basophils Relative: 0 % (ref 0–1)
Eosinophils Absolute: 0.1 10*3/uL (ref 0.0–0.7)
Hemoglobin: 14.5 g/dL (ref 12.0–15.0)
MCH: 26.8 pg (ref 26.0–34.0)
MCHC: 34.8 g/dL (ref 30.0–36.0)
Monocytes Absolute: 0.7 10*3/uL (ref 0.1–1.0)
Monocytes Relative: 8 % (ref 3–12)
Neutrophils Relative %: 60 % (ref 43–77)
RDW: 14.9 % (ref 11.5–15.5)

## 2012-10-22 LAB — BASIC METABOLIC PANEL
Chloride: 95 mEq/L — ABNORMAL LOW (ref 96–112)
GFR calc Af Amer: 88 mL/min — ABNORMAL LOW (ref >90)
Potassium: 3.7 mEq/L (ref 3.5–5.1)

## 2012-10-22 LAB — URINALYSIS, ROUTINE W REFLEX MICROSCOPIC
Protein, ur: 30 mg/dL — AB
Urobilinogen, UA: 1 mg/dL (ref 0.0–1.0)

## 2012-10-22 LAB — POCT I-STAT, CHEM 8
BUN: 13 mg/dL (ref 6–23)
Chloride: 105 mEq/L (ref 96–112)
Sodium: 142 mEq/L (ref 135–145)
TCO2: 23 mmol/L (ref 0–100)

## 2012-10-22 LAB — URINE MICROSCOPIC-ADD ON

## 2012-10-22 LAB — RAPID URINE DRUG SCREEN, HOSP PERFORMED
Barbiturates: NOT DETECTED
Tetrahydrocannabinol: POSITIVE — AB

## 2012-10-22 MED ORDER — ONDANSETRON HCL 4 MG/2ML IJ SOLN
4.0000 mg | Freq: Once | INTRAMUSCULAR | Status: AC
  Administered 2012-10-22: 4 mg via INTRAVENOUS
  Filled 2012-10-22: qty 2

## 2012-10-22 MED ORDER — METHOCARBAMOL 500 MG PO TABS
1000.0000 mg | ORAL_TABLET | Freq: Once | ORAL | Status: AC
  Administered 2012-10-22: 1000 mg via ORAL
  Filled 2012-10-22: qty 2

## 2012-10-22 MED ORDER — NITROFURANTOIN MONOHYD MACRO 100 MG PO CAPS: 100.0000 mg | ORAL_CAPSULE | Freq: Two times a day (BID) | ORAL | Status: DC

## 2012-10-22 MED ORDER — KETOROLAC TROMETHAMINE 10 MG PO TABS: 10.0000 mg | ORAL_TABLET | Freq: Four times a day (QID) | ORAL | Status: DC | PRN

## 2012-10-22 MED ORDER — KETOROLAC TROMETHAMINE 30 MG/ML IJ SOLN
30.0000 mg | Freq: Once | INTRAMUSCULAR | Status: AC
  Administered 2012-10-22: 30 mg via INTRAVENOUS
  Filled 2012-10-22: qty 1

## 2012-10-22 MED ORDER — ONDANSETRON 8 MG PO TBDP: ORAL_TABLET | ORAL | Status: DC

## 2012-10-22 NOTE — ED Notes (Signed)
Pt c/o left flank pain x 3-4 hours radiating to abd with nause. Pt has hx of kidney stones.

## 2012-10-22 NOTE — ED Notes (Signed)
Pt has hx of opiate abuse and quit methadone clinic x 1 month ago

## 2012-10-22 NOTE — ED Provider Notes (Addendum)
History     CSN: [id number]  Arrival date & time 10/22/12  0541   First MD Initiated Contact with Patient 10/22/12 [phone]      Chief Complaint  Patient presents with  . Flank Pain    (Consider location/radiation/quality/duration/timing/severity/associated sxs/prior treatment) Patient is a 46 y.o. female presenting with flank pain and frequency. The history is provided by the patient. No language interpreter was used.  Flank Pain This is a recurrent problem. The current episode started 3 to 5 hours ago. The problem occurs constantly. The problem has not changed since onset.Pertinent negatives include no chest pain, no headaches and no shortness of breath. Nothing aggravates the symptoms. Nothing relieves the symptoms. She has tried nothing for the symptoms. The treatment provided no relief.  Urinary Frequency This is a new problem. The current episode started more than 2 days ago. The problem occurs constantly. The problem has not changed since onset.Pertinent negatives include no chest pain, no headaches and no shortness of breath. Nothing aggravates the symptoms. Nothing relieves the symptoms. She has tried nothing for the symptoms. The treatment provided no relief.    Past Medical History  Diagnosis Date  . Drug abuse   . Depression   . Bipolar 2 disorder   . Attention deficit hyperactivity disorder (ADHD)     Past Surgical History  Procedure Date  . Abdominal hysterectomy     No family history on file.  History  Substance Use Topics  . Smoking status: Current Every Day Smoker -- 0.5 packs/day for 25 years    Types: Cigarettes  . Smokeless tobacco: Not on file  . Alcohol Use: No    OB History    Grav Para Term Preterm Abortions TAB SAB Ect Mult Living                  Review of Systems  Respiratory: Negative for shortness of breath.   Cardiovascular: Negative for chest pain.  Gastrointestinal: Positive for nausea. Negative for vomiting and diarrhea.    Genitourinary: Positive for frequency and flank pain.  Neurological: Negative for headaches.  All other systems reviewed and are negative.    Allergies  Compazine and Penicillins  Home Medications   Current Outpatient Rx  Name  Route  Sig  Dispense  Refill  . AMPHETAMINE-DEXTROAMPHETAMINE 30 MG PO TABS   Oral   Take 30 mg by mouth daily.         . BUPROPION HCL ER (SR) 150 MG PO TB12   Oral   Take 150 mg by mouth 2 (two) times daily.         Marland Kitchen METHADONE HCL 10 MG/ML PO CONC   Oral   Take 130 mg by mouth daily.         . QUETIAPINE FUMARATE 300 MG PO TABS   Oral   Take 300 mg by mouth at bedtime.         Marland Kitchen RAMELTEON 8 MG PO TABS   Oral   Take 8 mg by mouth at bedtime.         . SERTRALINE HCL 100 MG PO TABS   Oral   Take 200 mg by mouth daily.           BP 154/84  Pulse 75  Resp 18  SpO2 100%  Physical Exam  Constitutional: She is oriented to person, place, and time. She appears well-developed and well-nourished. No distress.  HENT:  Head: Normocephalic and atraumatic.  Mouth/Throat: Oropharynx is clear  and moist.  Eyes: Conjunctivae normal are normal. Pupils are equal, round, and reactive to light.  Neck: Normal range of motion. Neck supple.  Cardiovascular: Normal rate and regular rhythm.   Pulmonary/Chest: Effort normal and breath sounds normal. She has no wheezes. She has no rales.  Abdominal: Soft. Bowel sounds are normal. She exhibits no distension. There is no tenderness. There is no rebound and no guarding.  Musculoskeletal: Normal range of motion.  Neurological: She is alert and oriented to person, place, and time.  Skin: Skin is warm.  Psychiatric: She is agitated.    ED Course  Procedures (including critical care time)   Labs Reviewed  CBC WITH DIFFERENTIAL  BASIC METABOLIC PANEL  URINALYSIS, ROUTINE W REFLEX MICROSCOPIC  URINE RAPID DRUG SCREEN (HOSP PERFORMED)   No results found.   No diagnosis found.    MDM   Patient denies any medication or past medical history excepting kidney stones.  States she cannot give urine sample nor lay on back to give urine.    Given patient's history will advise follow up with her pain management specialist.  Will prescribe toradol PO and zofran ODT.  Will treat as infected stone and provide abx.  Follow up in seven days with urology      Adriane Guglielmo K Aquiles Ruffini-Rasch, MD 10/22/12 0641  Arali Somera K Senovia Gauer-Rasch, MD 10/22/12 [phone]

## 2012-10-23 LAB — URINE CULTURE

## 2012-12-23 ENCOUNTER — Emergency Department (HOSPITAL_COMMUNITY)
Admission: EM | Admit: 2012-12-23 | Discharge: 2012-12-23 | Disposition: A | Discharge status: Home / Self Care | Payer: Self-pay | Attending: Emergency Medicine | Admitting: Emergency Medicine

## 2012-12-23 ENCOUNTER — Encounter (HOSPITAL_COMMUNITY): Payer: Self-pay | Admitting: *Deleted

## 2012-12-23 DIAGNOSIS — F3189 Other bipolar disorder: Secondary | ICD-10-CM | POA: Insufficient documentation

## 2012-12-23 DIAGNOSIS — M545 Low back pain, unspecified: Secondary | ICD-10-CM | POA: Insufficient documentation

## 2012-12-23 DIAGNOSIS — F909 Attention-deficit hyperactivity disorder, unspecified type: Secondary | ICD-10-CM | POA: Insufficient documentation

## 2012-12-23 DIAGNOSIS — F329 Major depressive disorder, single episode, unspecified: Secondary | ICD-10-CM | POA: Insufficient documentation

## 2012-12-23 DIAGNOSIS — F3289 Other specified depressive episodes: Secondary | ICD-10-CM | POA: Insufficient documentation

## 2012-12-23 DIAGNOSIS — F17200 Nicotine dependence, unspecified, uncomplicated: Secondary | ICD-10-CM | POA: Insufficient documentation

## 2012-12-23 DIAGNOSIS — M549 Dorsalgia, unspecified: Secondary | ICD-10-CM

## 2012-12-23 DIAGNOSIS — Z79899 Other long term (current) drug therapy: Secondary | ICD-10-CM | POA: Insufficient documentation

## 2012-12-23 MED ORDER — NAPROXEN 500 MG PO TABS: 500.0000 mg | ORAL_TABLET | Freq: Two times a day (BID) | ORAL | Status: DC

## 2012-12-23 MED ORDER — METHOCARBAMOL 500 MG PO TABS: 1000.0000 mg | ORAL_TABLET | Freq: Four times a day (QID) | ORAL | Status: DC

## 2012-12-23 MED ORDER — TRAMADOL HCL 50 MG PO TABS: 50.0000 mg | ORAL_TABLET | Freq: Four times a day (QID) | ORAL | Status: DC | PRN

## 2012-12-23 NOTE — ED Notes (Signed)
Pt states for the past 7 weeks she's had R sided lower back pain that spasms down R leg. Pt states constant pain but worse when moving from sitting to standing position.

## 2012-12-23 NOTE — ED Provider Notes (Signed)
Medical screening examination/treatment/procedure(s) were performed by non-physician practitioner and as supervising physician I was immediately available for consultation/collaboration.  Gerhard Munch, MD 12/23/12 [phone]

## 2012-12-23 NOTE — ED Provider Notes (Signed)
History     CSN: [id number]  Arrival date & time 12/23/12  1135   First MD Initiated Contact with Patient 12/23/12 1304      Chief Complaint  Patient presents with  . Back Pain    right lower  . Leg Pain    right    (Consider location/radiation/quality/duration/timing/severity/associated sxs/prior treatment) HPI Comments: Patient with previous history of narcotics abuse presents with complaint of right lower back pain with radiation of pain down into her right legs. Patient states that she has had this for the past 7 weeks. She states that she is tired of dealing with the pain. She's been taking ibuprofen without relief. She has been using heat without relief. Patient denies red flag signs and symptoms of lower back pain. Patient does not have any difficulty sleeping. Pain is worse when going from a sitting to standing position. It is described as sharp. Onset gradual. Course is constant. Nothing makes symptoms better or worse.  The history is provided by the patient.    Past Medical History  Diagnosis Date  . Drug abuse   . Depression   . Bipolar 2 disorder   . Attention deficit hyperactivity disorder (ADHD)     Past Surgical History  Procedure Date  . Abdominal hysterectomy     History reviewed. No pertinent family history.  History  Substance Use Topics  . Smoking status: Current Every Day Smoker -- 0.5 packs/day for 25 years    Types: Cigarettes  . Smokeless tobacco: Not on file  . Alcohol Use: No    OB History    Grav Para Term Preterm Abortions TAB SAB Ect Mult Living                  Review of Systems  Constitutional: Negative for fever and unexpected weight change.  Gastrointestinal: Negative for constipation.       Negative for fecal incontinence.   Genitourinary: Negative for dysuria, hematuria, flank pain, vaginal bleeding, vaginal discharge and pelvic pain.       Negative for urinary incontinence or retention.  Musculoskeletal: Positive for back  pain.  Neurological: Negative for weakness and numbness.       Denies saddle paresthesias.    Allergies  Compazine and Penicillins  Home Medications   Current Outpatient Rx  Name  Route  Sig  Dispense  Refill  . ALPRAZOLAM 0.5 MG PO TABS   Oral   Take 0.5 mg by mouth 3 (three) times daily as needed. anxiety         . AMPHETAMINE-DEXTROAMPHETAMINE 30 MG PO TABS   Oral   Take 30 mg by mouth every morning.          Marland Kitchen BUPROPION HCL ER (SR) 150 MG PO TB12   Oral   Take 150 mg by mouth 2 (two) times daily.         . SERTRALINE HCL 100 MG PO TABS   Oral   Take 100 mg by mouth 2 (two) times daily.            BP 142/75  Pulse 92  Temp 98.7 F (37.1 C) (Oral)  Resp 18  SpO2 97%  Physical Exam  Nursing note and vitals reviewed. Constitutional: She appears well-developed and well-nourished.  HENT:  Head: Normocephalic and atraumatic.  Eyes: Conjunctivae normal are normal.  Neck: Normal range of motion. Neck supple.  Pulmonary/Chest: Effort normal.  Abdominal: Soft. There is no tenderness. There is no CVA tenderness.  Musculoskeletal:  Normal range of motion.       Cervical back: She exhibits no tenderness and no bony tenderness.       Thoracic back: She exhibits no tenderness and no bony tenderness.       Lumbar back: She exhibits tenderness. She exhibits normal range of motion and no bony tenderness.       Back:       No step-off noted with palpation of spine. No tenderness with percussion of spine.  Neurological: She is alert. She has normal strength and normal reflexes. No sensory deficit.       5/5 strength in entire lower extremities bilaterally. No sensation deficit. Positive straight-leg  Skin: Skin is warm and dry. No rash noted.  Psychiatric: She has a normal mood and affect.    ED Course  Procedures (including critical care time)  Labs Reviewed - No data to display No results found.   1. Back pain     1:43 PM Patient seen and examined. Work-up  initiated. Medications ordered.   Vital signs reviewed and are as follows: Filed Vitals:   12/23/12 1238  BP: 142/75  Pulse: 92  Temp: 98.7 F (37.1 C)  Resp: 18    No red flag s/s of low back pain. Patient was counseled on back pain precautions and told to do activity as tolerated but do not lift, push, or pull heavy objects more than 10 pounds for the next week.  Patient counseled to use ice or heat on back for no longer than 15 minutes every hour.   Patient prescribed muscle relaxer and counseled on proper use of muscle relaxant medication.    Patient prescribed narcotic pain medicine (ultram) and counseled on proper use of narcotic pain medications. Counseled not to combine this medication with others containing tylenol.   Urged patient not to drink alcohol, drive, or perform any other activities that requires focus while taking either of these medications.  Patient urged to follow-up with PCP if pain does not improve with treatment and rest or if pain becomes recurrent. Urged to return with worsening severe pain, loss of bowel or bladder control, trouble walking.   The patient verbalizes understanding and agrees with the plan.     MDM  Patient with back pain. No neurological deficits. Patient is ambulatory. No warning symptoms of back pain including: loss of bowel or bladder control, night sweats, waking from sleep with back pain, unexplained fevers or weight loss, h/o cancer, IVDU, recent trauma. No concern for cauda equina, epidural abscess, or other serious cause of back pain. Conservative measures such as rest, ice/heat and pain medicine indicated with PCP follow-up if no improvement with conservative management.         Renne Crigler, [place] 12/23/12 1627

## 2013-04-05 ENCOUNTER — Encounter (HOSPITAL_COMMUNITY): Payer: Self-pay

## 2013-04-05 ENCOUNTER — Emergency Department (HOSPITAL_COMMUNITY)
Admission: EM | Admit: 2013-04-05 | Discharge: 2013-04-05 | Disposition: A | Discharge status: Home / Self Care | Payer: Self-pay | Attending: Emergency Medicine | Admitting: Emergency Medicine

## 2013-04-05 DIAGNOSIS — L255 Unspecified contact dermatitis due to plants, except food: Secondary | ICD-10-CM | POA: Insufficient documentation

## 2013-04-05 DIAGNOSIS — L299 Pruritus, unspecified: Secondary | ICD-10-CM | POA: Insufficient documentation

## 2013-04-05 DIAGNOSIS — F909 Attention-deficit hyperactivity disorder, unspecified type: Secondary | ICD-10-CM | POA: Insufficient documentation

## 2013-04-05 DIAGNOSIS — Z880 Allergy status to penicillin: Secondary | ICD-10-CM | POA: Insufficient documentation

## 2013-04-05 DIAGNOSIS — F17200 Nicotine dependence, unspecified, uncomplicated: Secondary | ICD-10-CM | POA: Insufficient documentation

## 2013-04-05 DIAGNOSIS — Z79899 Other long term (current) drug therapy: Secondary | ICD-10-CM | POA: Insufficient documentation

## 2013-04-05 DIAGNOSIS — L237 Allergic contact dermatitis due to plants, except food: Secondary | ICD-10-CM

## 2013-04-05 DIAGNOSIS — F3189 Other bipolar disorder: Secondary | ICD-10-CM | POA: Insufficient documentation

## 2013-04-05 MED ORDER — DEXAMETHASONE SODIUM PHOSPHATE 4 MG/ML IJ SOLN
6.0000 mg | Freq: Once | INTRAMUSCULAR | Status: AC
  Administered 2013-04-05: 6 mg via INTRAMUSCULAR
  Filled 2013-04-05: qty 2

## 2013-04-05 MED ORDER — DIPHENHYDRAMINE HCL 50 MG/ML IJ SOLN
50.0000 mg | Freq: Once | INTRAMUSCULAR | Status: AC
  Administered 2013-04-05: 50 mg via INTRAMUSCULAR
  Filled 2013-04-05: qty 1

## 2013-04-05 MED ORDER — PREDNISONE 20 MG PO TABS: 40.0000 mg | ORAL_TABLET | Freq: Every day | ORAL | Status: DC

## 2013-04-05 MED ORDER — HYDROXYZINE HCL 25 MG PO TABS: 25.0000 mg | ORAL_TABLET | Freq: Four times a day (QID) | ORAL | Status: DC | PRN

## 2013-04-05 NOTE — ED Notes (Signed)
Pt reports rash to body since last Monday, has not been to a doctor.  Areas itch.

## 2013-04-05 NOTE — ED Notes (Signed)
Pt waiting on med time.

## 2013-04-05 NOTE — ED Provider Notes (Signed)
History     CSN: [id number]  Arrival date & time 04/05/13  1429   First MD Initiated Contact with Patient 04/05/13 1441      Chief Complaint  Patient presents with  . Rash    (Consider location/radiation/quality/duration/timing/severity/associated sxs/prior treatment) HPI Comments: 47 year old female who presents with a rash, started for 5 days ago after cleaning out a natural area with lots of foliage. She states that the rash started within 24 hours, has been persistent, very itchy and has draining lesions. She has been scratching her skin vigorously, has no associated fevers, vomiting or difficulty breathing.  Patient is a 47 y.o. female presenting with rash.  Rash Associated symptoms: no fever     Past Medical History  Diagnosis Date  . Drug abuse   . Depression   . Bipolar 2 disorder   . Attention deficit hyperactivity disorder (ADHD)     Past Surgical History  Procedure Laterality Date  . Abdominal hysterectomy      No family history on file.  History  Substance Use Topics  . Smoking status: Current Every Day Smoker -- 0.50 packs/day for 25 years    Types: Cigarettes  . Smokeless tobacco: Not on file  . Alcohol Use: No    OB History   Grav Para Term Preterm Abortions TAB SAB Ect Mult Living                  Review of Systems  Constitutional: Negative for fever.  Musculoskeletal: Negative for joint swelling.  Skin: Positive for rash.    Allergies  Compazine and Penicillins  Home Medications   Current Outpatient Rx  Name  Route  Sig  Dispense  Refill  . ALPRAZolam (XANAX) 0.5 MG tablet   Oral   Take 0.5 mg by mouth 3 (three) times daily as needed. anxiety         . amphetamine-dextroamphetamine (ADDERALL) 30 MG tablet   Oral   Take 30 mg by mouth every morning.          Marland Kitchen buPROPion (WELLBUTRIN SR) 150 MG 12 hr tablet   Oral   Take 150 mg by mouth 2 (two) times daily.         . hydrOXYzine (ATARAX/VISTARIL) 25 MG tablet   Oral  Take 1 tablet (25 mg total) by mouth every 6 (six) hours as needed for itching.   30 tablet   0   . methocarbamol (ROBAXIN) 500 MG tablet   Oral   Take 2 tablets (1,000 mg total) by mouth 4 (four) times daily.   20 tablet   0   . naproxen (NAPROSYN) 500 MG tablet   Oral   Take 1 tablet (500 mg total) by mouth 2 (two) times daily.   20 tablet   0   . predniSONE (DELTASONE) 20 MG tablet   Oral   Take 2 tablets (40 mg total) by mouth daily.   20 tablet   0   . sertraline (ZOLOFT) 100 MG tablet   Oral   Take 100 mg by mouth 2 (two) times daily.          . traMADol (ULTRAM) 50 MG tablet   Oral   Take 1 tablet (50 mg total) by mouth every 6 (six) hours as needed for pain.   15 tablet   0     BP 129/100  Pulse 103  Temp(Src) 98 F (36.7 C) (Oral)  Resp 20  Ht 5\' 7"  (1.702 m)  Wt  170 lb (77.111 kg)  BMI 26.62 kg/m2  SpO2 100%  Physical Exam  Constitutional: She appears well-developed and well-nourished.  Uncomfortable appearing  HENT:  Head: Normocephalic and atraumatic.  Eyes: Conjunctivae are normal. Right eye exhibits no discharge. Left eye exhibits no discharge. No scleral icterus.  Cardiovascular: Normal rate and regular rhythm.   Pulmonary/Chest: Effort normal and breath sounds normal.  Musculoskeletal: Normal range of motion. She exhibits no edema and no tenderness.  Rash present on leg  Skin:  The patient has multiple linear vesicular lesions, these are vesicular, not pustular, congregate on the legs below the thighs and arms below the mid upper extremity. There are no truncal lesions, no lesions in the mouth, no lesions on the face.    ED Course  Procedures (including critical care time)  Labs Reviewed - No data to display No results found.   1. Poison ivy dermatitis       MDM  History and exam consistent with poison ivy exposure and a contact dermatitis. She will be treated with intramuscular Benadryl and steroid as she has significant  symptoms and diffuse rash consistent with poison ivy. She will be sent home with Vistaril as well as prednisone. No signs of secondary or superinfection.      Meds given in ED:  Medications  diphenhydrAMINE (BENADRYL) injection 50 mg (not administered)  dexamethasone (DECADRON) injection 6 mg (not administered)    New Prescriptions   HYDROXYZINE (ATARAX/VISTARIL) 25 MG TABLET    Take 1 tablet (25 mg total) by mouth every 6 (six) hours as needed for itching.   PREDNISONE (DELTASONE) 20 MG TABLET    Take 2 tablets (40 mg total) by mouth daily.      Vida Roller, MD 04/05/13 1450

## 2013-04-20 ENCOUNTER — Encounter (HOSPITAL_COMMUNITY): Payer: Self-pay | Admitting: *Deleted

## 2013-04-20 ENCOUNTER — Emergency Department (HOSPITAL_COMMUNITY)
Admission: EM | Admit: 2013-04-20 | Discharge: 2013-04-20 | Disposition: A | Discharge status: Home / Self Care | Payer: Self-pay | Attending: Emergency Medicine | Admitting: Emergency Medicine

## 2013-04-20 DIAGNOSIS — IMO0002 Reserved for concepts with insufficient information to code with codable children: Secondary | ICD-10-CM | POA: Insufficient documentation

## 2013-04-20 DIAGNOSIS — F3289 Other specified depressive episodes: Secondary | ICD-10-CM | POA: Insufficient documentation

## 2013-04-20 DIAGNOSIS — F909 Attention-deficit hyperactivity disorder, unspecified type: Secondary | ICD-10-CM | POA: Insufficient documentation

## 2013-04-20 DIAGNOSIS — Z880 Allergy status to penicillin: Secondary | ICD-10-CM | POA: Insufficient documentation

## 2013-04-20 DIAGNOSIS — Y9389 Activity, other specified: Secondary | ICD-10-CM | POA: Insufficient documentation

## 2013-04-20 DIAGNOSIS — S39012A Strain of muscle, fascia and tendon of lower back, initial encounter: Secondary | ICD-10-CM

## 2013-04-20 DIAGNOSIS — Y9289 Other specified places as the place of occurrence of the external cause: Secondary | ICD-10-CM | POA: Insufficient documentation

## 2013-04-20 DIAGNOSIS — S99919A Unspecified injury of unspecified ankle, initial encounter: Secondary | ICD-10-CM | POA: Insufficient documentation

## 2013-04-20 DIAGNOSIS — X500XXA Overexertion from strenuous movement or load, initial encounter: Secondary | ICD-10-CM | POA: Insufficient documentation

## 2013-04-20 DIAGNOSIS — Z79899 Other long term (current) drug therapy: Secondary | ICD-10-CM | POA: Insufficient documentation

## 2013-04-20 DIAGNOSIS — S8990XA Unspecified injury of unspecified lower leg, initial encounter: Secondary | ICD-10-CM | POA: Insufficient documentation

## 2013-04-20 DIAGNOSIS — F17200 Nicotine dependence, unspecified, uncomplicated: Secondary | ICD-10-CM | POA: Insufficient documentation

## 2013-04-20 DIAGNOSIS — F329 Major depressive disorder, single episode, unspecified: Secondary | ICD-10-CM | POA: Insufficient documentation

## 2013-04-20 DIAGNOSIS — S335XXA Sprain of ligaments of lumbar spine, initial encounter: Secondary | ICD-10-CM | POA: Insufficient documentation

## 2013-04-20 MED ORDER — METHOCARBAMOL 500 MG PO TABS: ORAL_TABLET | ORAL | Status: DC

## 2013-04-20 MED ORDER — KETOROLAC TROMETHAMINE 10 MG PO TABS
10.0000 mg | ORAL_TABLET | Freq: Once | ORAL | Status: AC
  Administered 2013-04-20: 10 mg via ORAL
  Filled 2013-04-20: qty 1

## 2013-04-20 MED ORDER — PREDNISONE 50 MG PO TABS
60.0000 mg | ORAL_TABLET | Freq: Once | ORAL | Status: AC
  Administered 2013-04-20: 60 mg via ORAL
  Filled 2013-04-20: qty 1

## 2013-04-20 MED ORDER — MELOXICAM 7.5 MG PO TABS: ORAL_TABLET | ORAL | Status: DC

## 2013-04-20 MED ORDER — ONDANSETRON HCL 4 MG PO TABS
4.0000 mg | ORAL_TABLET | Freq: Once | ORAL | Status: AC
  Administered 2013-04-20: 4 mg via ORAL
  Filled 2013-04-20: qty 1

## 2013-04-20 MED ORDER — PREDNISONE 10 MG PO TABS: ORAL_TABLET | ORAL | Status: DC

## 2013-04-20 NOTE — ED Notes (Signed)
Pain rt lower back , "swinging a maddock".

## 2013-04-20 NOTE — ED Provider Notes (Signed)
History     CSN: [id number]  Arrival date & time 04/20/13  1616   First MD Initiated Contact with Patient 04/20/13 1754      Chief Complaint  Patient presents with  . Back Pain    (Consider location/radiation/quality/duration/timing/severity/associated sxs/prior treatment) Patient is a 47 y.o. female presenting with back pain. The history is provided by the patient.  Back Pain Location:  Lumbar spine Quality:  Aching and cramping Radiates to:  L knee (left calf) Associated symptoms: no abdominal pain, no chest pain and no dysuria     Past Medical History  Diagnosis Date  . Drug abuse   . Depression   . Bipolar 2 disorder   . Attention deficit hyperactivity disorder (ADHD)     Past Surgical History  Procedure Laterality Date  . Abdominal hysterectomy      History reviewed. No pertinent family history.  History  Substance Use Topics  . Smoking status: Current Every Day Smoker -- 0.50 packs/day for 25 years    Types: Cigarettes  . Smokeless tobacco: Not on file  . Alcohol Use: No    OB History   Grav Para Term Preterm Abortions TAB SAB Ect Mult Living                  Review of Systems  Constitutional: Negative for activity change.       All ROS Neg except as noted in HPI  HENT: Negative for nosebleeds and neck pain.   Eyes: Negative for photophobia and discharge.  Respiratory: Negative for cough, shortness of breath and wheezing.   Cardiovascular: Negative for chest pain and palpitations.  Gastrointestinal: Negative for abdominal pain and blood in stool.  Genitourinary: Negative for dysuria, frequency and hematuria.  Musculoskeletal: Positive for back pain. Negative for arthralgias.  Skin: Negative.   Neurological: Negative for dizziness, seizures and speech difficulty.  Psychiatric/Behavioral: Negative for hallucinations and confusion.       Depression    Allergies  Compazine and Penicillins  Home Medications   Current Outpatient Rx  Name   Route  Sig  Dispense  Refill  . ALPRAZolam (XANAX) 0.5 MG tablet   Oral   Take 0.5 mg by mouth 3 (three) times daily as needed. anxiety         . amphetamine-dextroamphetamine (ADDERALL) 30 MG tablet   Oral   Take 30 mg by mouth every morning.          Marland Kitchen buPROPion (WELLBUTRIN SR) 150 MG 12 hr tablet   Oral   Take 150 mg by mouth 2 (two) times daily.         . hydrOXYzine (ATARAX/VISTARIL) 25 MG tablet   Oral   Take 1 tablet (25 mg total) by mouth every 6 (six) hours as needed for itching.   30 tablet   0   . methocarbamol (ROBAXIN) 500 MG tablet   Oral   Take 2 tablets (1,000 mg total) by mouth 4 (four) times daily.   20 tablet   0   . naproxen (NAPROSYN) 500 MG tablet   Oral   Take 1 tablet (500 mg total) by mouth 2 (two) times daily.   20 tablet   0   . predniSONE (DELTASONE) 20 MG tablet   Oral   Take 2 tablets (40 mg total) by mouth daily.   20 tablet   0   . sertraline (ZOLOFT) 100 MG tablet   Oral   Take 100 mg by mouth 2 (two) times  daily.          . traMADol (ULTRAM) 50 MG tablet   Oral   Take 1 tablet (50 mg total) by mouth every 6 (six) hours as needed for pain.   15 tablet   0     BP 159/88  Pulse 110  Temp(Src) 98.7 F (37.1 C) (Oral)  Resp 18  Ht 5\' 7"  (1.702 m)  Wt 180 lb (81.647 kg)  BMI 28.19 kg/m2  SpO2 100%  Physical Exam  Nursing note and vitals reviewed. Constitutional: She is oriented to person, place, and time. She appears well-developed and well-nourished.  Non-toxic appearance.  HENT:  Head: Normocephalic.  Right Ear: Tympanic membrane and external ear normal.  Left Ear: Tympanic membrane and external ear normal.  Eyes: EOM and lids are normal. Pupils are equal, round, and reactive to light.  Neck: Normal range of motion. Neck supple. Carotid bruit is not present.  Cardiovascular: Normal rate, regular rhythm, normal heart sounds, intact distal pulses and normal pulses.   Pulmonary/Chest: Breath sounds normal. No  respiratory distress.  Abdominal: Soft. Bowel sounds are normal. There is no tenderness. There is no guarding.  Musculoskeletal: Normal range of motion.  Mild to moderate lumbar tenderness to palpation and with attempted ROM. No stepoff noted. No hot areas.  Pain and tightness of the left calf. No temp changes of right or left lower ext. Cap refill less than 3 sec.  Lymphadenopathy:       Head (right side): No submandibular adenopathy present.       Head (left side): No submandibular adenopathy present.    She has no cervical adenopathy.  Neurological: She is alert and oriented to person, place, and time. She has normal strength. No cranial nerve deficit or sensory deficit.  Skin: Skin is warm and dry.  Psychiatric: She has a normal mood and affect. Her speech is normal.    ED Course  Procedures (including critical care time)  Labs Reviewed - No data to display No results found.   No diagnosis found.    MDM  I have reviewed nursing notes, vital signs, and all appropriate lab and imaging results for this patient. Hx of back issues. Pt reinjured the lower back swinging an ax and mallet. No gross neuro deficit. Rx for robaxin, mobic, and robaxin given.       Kathie Dike, PA-C 04/22/13 2106

## 2013-04-20 NOTE — ED Notes (Signed)
In lower back radiating into right leg, states she was trying to chop up tree stumps with a mallot prior to arrival

## 2013-04-24 NOTE — ED Provider Notes (Signed)
Medical screening examination/treatment/procedure(s) were performed by non-physician practitioner and as supervising physician I was immediately available for consultation/collaboration.   Benny Lennert, MD 04/24/13 [phone]

## 2013-04-30 ENCOUNTER — Encounter (HOSPITAL_COMMUNITY): Payer: Self-pay | Admitting: Emergency Medicine

## 2013-04-30 ENCOUNTER — Emergency Department (HOSPITAL_COMMUNITY)
Admission: EM | Admit: 2013-04-30 | Discharge: 2013-04-30 | Disposition: A | Discharge status: Home / Self Care | Payer: Self-pay | Attending: Emergency Medicine | Admitting: Emergency Medicine

## 2013-04-30 DIAGNOSIS — F3189 Other bipolar disorder: Secondary | ICD-10-CM | POA: Insufficient documentation

## 2013-04-30 DIAGNOSIS — F17200 Nicotine dependence, unspecified, uncomplicated: Secondary | ICD-10-CM | POA: Insufficient documentation

## 2013-04-30 DIAGNOSIS — F909 Attention-deficit hyperactivity disorder, unspecified type: Secondary | ICD-10-CM | POA: Insufficient documentation

## 2013-04-30 DIAGNOSIS — N390 Urinary tract infection, site not specified: Secondary | ICD-10-CM | POA: Insufficient documentation

## 2013-04-30 DIAGNOSIS — R300 Dysuria: Secondary | ICD-10-CM | POA: Insufficient documentation

## 2013-04-30 DIAGNOSIS — Z87442 Personal history of urinary calculi: Secondary | ICD-10-CM | POA: Insufficient documentation

## 2013-04-30 DIAGNOSIS — Z880 Allergy status to penicillin: Secondary | ICD-10-CM | POA: Insufficient documentation

## 2013-04-30 DIAGNOSIS — Z79899 Other long term (current) drug therapy: Secondary | ICD-10-CM | POA: Insufficient documentation

## 2013-04-30 DIAGNOSIS — Z3202 Encounter for pregnancy test, result negative: Secondary | ICD-10-CM | POA: Insufficient documentation

## 2013-04-30 LAB — CBC WITH DIFFERENTIAL/PLATELET
Eosinophils Relative: 1 % (ref 0–5)
HCT: 42.8 % (ref 36.0–46.0)
Hemoglobin: 13.8 g/dL (ref 12.0–15.0)
Lymphocytes Relative: 24 % (ref 12–46)
Lymphs Abs: 2.4 10*3/uL (ref 0.7–4.0)
MCV: 82.1 fL (ref 78.0–100.0)
Monocytes Absolute: 0.6 10*3/uL (ref 0.1–1.0)
Monocytes Relative: 6 % (ref 3–12)
Platelets: 230 10*3/uL (ref 150–400)
RBC: 5.21 MIL/uL — ABNORMAL HIGH (ref 3.87–5.11)
WBC: 9.9 10*3/uL (ref 4.0–10.5)

## 2013-04-30 LAB — COMPREHENSIVE METABOLIC PANEL
ALT: 115 U/L — ABNORMAL HIGH (ref 0–35)
AST: 60 U/L — ABNORMAL HIGH (ref 0–37)
Alkaline Phosphatase: 139 U/L — ABNORMAL HIGH (ref 39–117)
Calcium: 10.1 mg/dL (ref 8.4–10.5)
Potassium: 3.5 mEq/L (ref 3.5–5.1)
Sodium: 141 mEq/L (ref 135–145)
Total Protein: 8.1 g/dL (ref 6.0–8.3)

## 2013-04-30 LAB — POCT PREGNANCY, URINE: Preg Test, Ur: NEGATIVE

## 2013-04-30 LAB — URINE MICROSCOPIC-ADD ON

## 2013-04-30 LAB — URINALYSIS, ROUTINE W REFLEX MICROSCOPIC
Bilirubin Urine: NEGATIVE
Glucose, UA: NEGATIVE mg/dL
Hgb urine dipstick: NEGATIVE
Specific Gravity, Urine: 1.026 (ref 1.005–1.030)
pH: 6 (ref 5.0–8.0)

## 2013-04-30 MED ORDER — ONDANSETRON HCL 4 MG/2ML IJ SOLN
4.0000 mg | Freq: Once | INTRAMUSCULAR | Status: AC
  Administered 2013-04-30: 4 mg via INTRAVENOUS
  Filled 2013-04-30: qty 2

## 2013-04-30 MED ORDER — SODIUM CHLORIDE 0.9 % IV SOLN
1000.0000 mL | Freq: Once | INTRAVENOUS | Status: AC
Start: 2013-04-30 — End: 2013-04-30
  Administered 2013-04-30: 1000 mL via INTRAVENOUS

## 2013-04-30 MED ORDER — CEPHALEXIN 250 MG PO CAPS
250.0000 mg | ORAL_CAPSULE | Freq: Once | ORAL | Status: AC
  Administered 2013-04-30: 250 mg via ORAL
  Filled 2013-04-30 (×2): qty 1

## 2013-04-30 MED ORDER — SODIUM CHLORIDE 0.9 % IV SOLN
1000.0000 mL | INTRAVENOUS | Status: DC
  Administered 2013-04-30: 1000 mL via INTRAVENOUS

## 2013-04-30 MED ORDER — HYDROMORPHONE HCL PF 1 MG/ML IJ SOLN
0.5000 mg | INTRAMUSCULAR | Status: DC | PRN
  Administered 2013-04-30 (×2): 0.5 mg via INTRAVENOUS
  Filled 2013-04-30 (×2): qty 1

## 2013-04-30 MED ORDER — CEPHALEXIN 500 MG PO CAPS: 500.0000 mg | ORAL_CAPSULE | Freq: Four times a day (QID) | ORAL | Status: DC

## 2013-04-30 MED ORDER — HYDROCODONE-ACETAMINOPHEN 5-325 MG PO TABS: 1.0000 | ORAL_TABLET | Freq: Four times a day (QID) | ORAL | Status: DC | PRN

## 2013-04-30 NOTE — ED Provider Notes (Signed)
History    CSN: [id number] Arrival date & time 04/30/13  4098 First MD Initiated Contact with Patient 04/30/13 [phone]      Chief Complaint  Patient presents with  . right flank pain     HPI Patient presents emergent complaints of right flank pain for the last few weeks. She initially thought it might be related to her back. However over the last symptoms have not improved and she thinks it may be related to kidney stones. She does have a history of kidney stones in the past. The pain is sharp in the right flank area. It radiates towards her anterior lower abdomen. Last night she felt like she is having some difficulty urinating. She has not had any fevers. She has not had any vomiting. She has not any change in her appetite. She has been taking Naprosyn, prednisone and Ultram for her back but that has not seemed to help.  Past Medical History  Diagnosis Date  . Drug abuse   . Depression   . Bipolar 2 disorder   . Attention deficit hyperactivity disorder (ADHD)     Past Surgical History  Procedure Laterality Date  . Abdominal hysterectomy      No family history on file.  History  Substance Use Topics  . Smoking status: Current Every Day Smoker -- 0.50 packs/day for 25 years    Types: Cigarettes  . Smokeless tobacco: Not on file  . Alcohol Use: No    OB History   Grav Para Term Preterm Abortions TAB SAB Ect Mult Living                  Review of Systems  All other systems reviewed and are negative.    Allergies  Compazine and Penicillins  Home Medications   Current Outpatient Rx  Name  Route  Sig  Dispense  Refill  . ALPRAZolam (XANAX) 0.5 MG tablet   Oral   Take 0.5 mg by mouth 3 (three) times daily as needed. anxiety         . amphetamine-dextroamphetamine (ADDERALL) 30 MG tablet   Oral   Take 30 mg by mouth every morning.          Marland Kitchen buPROPion (WELLBUTRIN SR) 150 MG 12 hr tablet   Oral   Take 150 mg by mouth 2 (two) times daily.         .  hydrOXYzine (ATARAX/VISTARIL) 25 MG tablet   Oral   Take 1 tablet (25 mg total) by mouth every 6 (six) hours as needed for itching.   30 tablet   0   . meloxicam (MOBIC) 7.5 MG tablet      1 po bid with food   12 tablet   0   . methocarbamol (ROBAXIN) 500 MG tablet      2 po tid for spasm/pain   30 tablet   0   . predniSONE (DELTASONE) 20 MG tablet   Oral   Take 2 tablets (40 mg total) by mouth daily.   20 tablet   0   . sertraline (ZOLOFT) 100 MG tablet   Oral   Take 100 mg by mouth 2 (two) times daily.          . cephALEXin (KEFLEX) 500 MG capsule   Oral   Take 1 capsule (500 mg total) by mouth 4 (four) times daily.   40 capsule   0   . HYDROcodone-acetaminophen (NORCO) 5-325 MG per tablet   Oral  Take 1-2 tablets by mouth every 6 (six) hours as needed for pain.   16 tablet   0     BP 146/94  Pulse 122  Temp(Src) 98.7 F (37.1 C) (Oral)  Resp 18  SpO2 100%  Physical Exam  Nursing note and vitals reviewed. Constitutional: She appears well-developed and well-nourished. No distress.  HENT:  Head: Normocephalic and atraumatic.  Right Ear: External ear normal.  Left Ear: External ear normal.  Eyes: Conjunctivae are normal. Right eye exhibits no discharge. Left eye exhibits no discharge. No scleral icterus.  Neck: Neck supple. No tracheal deviation present.  Cardiovascular: Normal rate, regular rhythm and intact distal pulses.   Pulmonary/Chest: Effort normal and breath sounds normal. No stridor. No respiratory distress. She has no wheezes. She has no rales.  Abdominal: Soft. Bowel sounds are normal. She exhibits no distension. There is no tenderness. There is CVA tenderness (Right-sided). There is no rebound and no guarding. No hernia.  Musculoskeletal: She exhibits no edema and no tenderness.  Neurological: She is alert. She has normal strength. No sensory deficit. Cranial nerve deficit:  no gross defecits noted. She exhibits normal muscle tone. She  displays no seizure activity. Coordination normal.  Skin: Skin is warm and dry. No rash noted.  Psychiatric: She has a normal mood and affect.    ED Course  Procedures (including critical care time) Medications  0.9 %  sodium chloride infusion (1,000 mLs Intravenous New Bag/Given 04/30/13 0938)    Followed by  0.9 %  sodium chloride infusion (1,000 mLs Intravenous New Bag/Given 04/30/13 0938)  HYDROmorphone (DILAUDID) injection 0.5 mg (0.5 mg Intravenous Given 04/30/13 1007)  cephALEXin (KEFLEX) capsule 250 mg (not administered)  ondansetron (ZOFRAN) injection 4 mg (4 mg Intravenous Given 04/30/13 0933)    Labs Reviewed  COMPREHENSIVE METABOLIC PANEL - Abnormal; Notable for the following:    Glucose, Bld 112 (*)    AST 60 (*)    ALT 115 (*)    Alkaline Phosphatase 139 (*)    Total Bilirubin 0.2 (*)    All other components within normal limits  LIPASE, BLOOD - Abnormal; Notable for the following:    Lipase 66 (*)    All other components within normal limits  URINALYSIS, ROUTINE W REFLEX MICROSCOPIC - Abnormal; Notable for the following:    Color, Urine AMBER (*)    APPearance CLOUDY (*)    Leukocytes, UA LARGE (*)    All other components within normal limits  CBC WITH DIFFERENTIAL - Abnormal; Notable for the following:    RBC 5.21 (*)    All other components within normal limits  URINE MICROSCOPIC-ADD ON - Abnormal; Notable for the following:    Squamous Epithelial / LPF FEW (*)    Bacteria, UA FEW (*)    Crystals CA OXALATE CRYSTALS (*)    All other components within normal limits  URINE CULTURE  POCT PREGNANCY, URINE   No results found.   1. UTI (urinary tract infection)       MDM  Ua is consistent with UTI.  Favor uti, early pyelo over renal colic.  Will dc home on oral abx.  Pt has pcn allergy (rash), should be safe to take oral cephalosporin.  Will dc home on pain meds, abx. Instructed patient that symptoms should be improving over next few days.  Warning signs and  precautions discussed.        Celene Kras, MD 04/30/13 1052

## 2013-04-30 NOTE — ED Notes (Signed)
MD at bedside. 

## 2013-04-30 NOTE — ED Notes (Signed)
Per pt, right flank pain for 2 weeks, thought it was related to back-increased symptoms last night-urinary retention

## 2013-04-30 NOTE — ED Notes (Signed)
Patient educated not to drive, operate heavy machinery, or drink alcohol while taking narcotic medication.

## 2013-05-01 LAB — URINE CULTURE: Culture: NO GROWTH

## 2013-07-22 ENCOUNTER — Emergency Department (HOSPITAL_COMMUNITY)
Admission: EM | Admit: 2013-07-22 | Discharge: 2013-07-22 | Disposition: A | Discharge status: Home / Self Care | Payer: Self-pay | Attending: Emergency Medicine | Admitting: Emergency Medicine

## 2013-07-22 ENCOUNTER — Encounter (HOSPITAL_COMMUNITY): Payer: Self-pay | Admitting: *Deleted

## 2013-07-22 DIAGNOSIS — Y9289 Other specified places as the place of occurrence of the external cause: Secondary | ICD-10-CM | POA: Insufficient documentation

## 2013-07-22 DIAGNOSIS — F3189 Other bipolar disorder: Secondary | ICD-10-CM | POA: Insufficient documentation

## 2013-07-22 DIAGNOSIS — R220 Localized swelling, mass and lump, head: Secondary | ICD-10-CM | POA: Insufficient documentation

## 2013-07-22 DIAGNOSIS — F329 Major depressive disorder, single episode, unspecified: Secondary | ICD-10-CM | POA: Insufficient documentation

## 2013-07-22 DIAGNOSIS — Z8659 Personal history of other mental and behavioral disorders: Secondary | ICD-10-CM | POA: Insufficient documentation

## 2013-07-22 DIAGNOSIS — X58XXXA Exposure to other specified factors, initial encounter: Secondary | ICD-10-CM | POA: Insufficient documentation

## 2013-07-22 DIAGNOSIS — K029 Dental caries, unspecified: Secondary | ICD-10-CM | POA: Insufficient documentation

## 2013-07-22 DIAGNOSIS — F3289 Other specified depressive episodes: Secondary | ICD-10-CM | POA: Insufficient documentation

## 2013-07-22 DIAGNOSIS — Y9389 Activity, other specified: Secondary | ICD-10-CM | POA: Insufficient documentation

## 2013-07-22 DIAGNOSIS — Z79899 Other long term (current) drug therapy: Secondary | ICD-10-CM | POA: Insufficient documentation

## 2013-07-22 DIAGNOSIS — Z880 Allergy status to penicillin: Secondary | ICD-10-CM | POA: Insufficient documentation

## 2013-07-22 DIAGNOSIS — F17200 Nicotine dependence, unspecified, uncomplicated: Secondary | ICD-10-CM | POA: Insufficient documentation

## 2013-07-22 MED ORDER — CLINDAMYCIN HCL 150 MG PO CAPS: 150.0000 mg | ORAL_CAPSULE | Freq: Three times a day (TID) | ORAL | Status: DC

## 2013-07-22 MED ORDER — DICLOFENAC SODIUM 75 MG PO TBEC: 75.0000 mg | DELAYED_RELEASE_TABLET | Freq: Two times a day (BID) | ORAL | Status: DC

## 2013-07-22 MED ORDER — TRAMADOL HCL 50 MG PO TABS: 50.0000 mg | ORAL_TABLET | Freq: Four times a day (QID) | ORAL | Status: DC | PRN

## 2013-07-22 NOTE — ED Notes (Signed)
Pt states dental pain. Pt attempted to pull her tooth during the night and the tooth broke off.

## 2013-07-22 NOTE — ED Provider Notes (Signed)
CSN: [id number]     Arrival date & time 07/22/13  1353 History     First MD Initiated Contact with Patient 07/22/13 1528     Chief Complaint  Patient presents with  . Dental Pain   (Consider location/radiation/quality/duration/timing/severity/associated sxs/prior Treatment) Patient is a 47 y.o. female presenting with tooth pain. The history is provided by the patient.  Dental Pain Location:  Upper Quality:  Pulsating Severity:  Severe Onset quality:  Sudden Duration:  1 day Timing:  Constant Progression:  Worsening Chronicity: acute on chronic problem. Context: dental caries, dental fracture and poor dentition   Relieved by:  Nothing Ineffective treatments:  NSAIDs Associated symptoms: gum swelling   Associated symptoms: no difficulty swallowing, no fever and no neck pain   Risk factors: smoking     Past Medical History  Diagnosis Date  . Drug abuse   . Depression   . Bipolar 2 disorder   . Attention deficit hyperactivity disorder (ADHD)    Past Surgical History  Procedure Laterality Date  . Abdominal hysterectomy     No family history on file. History  Substance Use Topics  . Smoking status: Current Every Day Smoker -- 0.50 packs/day for 25 years    Types: Cigarettes  . Smokeless tobacco: Not on file  . Alcohol Use: No   OB History   Grav Para Term Preterm Abortions TAB SAB Ect Mult Living                 Review of Systems  Constitutional: Negative for fever and activity change.       All ROS Neg except as noted in HPI  HENT: Negative for nosebleeds and neck pain.   Eyes: Negative for photophobia and discharge.  Respiratory: Negative for cough, shortness of breath and wheezing.   Cardiovascular: Negative for chest pain and palpitations.  Gastrointestinal: Negative for abdominal pain and blood in stool.  Genitourinary: Negative for dysuria, frequency and hematuria.  Musculoskeletal: Negative for back pain and arthralgias.  Skin: Negative.     Neurological: Negative for dizziness, seizures and speech difficulty.  Psychiatric/Behavioral: Negative for hallucinations and confusion.       Depression    Allergies  Compazine and Penicillins  Home Medications   Current Outpatient Rx  Name  Route  Sig  Dispense  Refill  . naproxen sodium (ALEVE) 220 MG tablet   Oral   Take 440 mg by mouth daily as needed (for pain).         Marland Kitchen sertraline (ZOLOFT) 100 MG tablet   Oral   Take 100 mg by mouth 2 (two) times daily.          . clindamycin (CLEOCIN) 150 MG capsule   Oral   Take 1 capsule (150 mg total) by mouth 3 (three) times daily.   21 capsule   0   . diclofenac (VOLTAREN) 75 MG EC tablet   Oral   Take 1 tablet (75 mg total) by mouth 2 (two) times daily.   14 tablet   0   . traMADol (ULTRAM) 50 MG tablet   Oral   Take 1 tablet (50 mg total) by mouth every 6 (six) hours as needed for pain.   15 tablet   0    BP 128/83  Pulse 83  Temp(Src) 98.9 F (37.2 C) (Oral)  Resp 16  SpO2 100% Physical Exam  Nursing note and vitals reviewed. Constitutional: She is oriented to person, place, and time. She appears well-developed and well-nourished.  Non-toxic appearance.  HENT:  Head: Normocephalic.  Right Ear: Tympanic membrane and external ear normal.  Left Ear: Tympanic membrane and external ear normal.  Mouth/Throat:    Multiple dental caries. No visible abscess. Airway patent. No swelling under the tongue.   Eyes: EOM and lids are normal. Pupils are equal, round, and reactive to light.  Neck: Normal range of motion. Neck supple. Carotid bruit is not present.  Cardiovascular: Normal rate, regular rhythm, normal heart sounds, intact distal pulses and normal pulses.   Pulmonary/Chest: Breath sounds normal. No respiratory distress.  Abdominal: Soft. Bowel sounds are normal. There is no tenderness. There is no guarding.  Musculoskeletal: Normal range of motion.  Lymphadenopathy:       Head (right side): No  submandibular adenopathy present.       Head (left side): No submandibular adenopathy present.    She has no cervical adenopathy.  Neurological: She is alert and oriented to person, place, and time. She has normal strength. No cranial nerve deficit or sensory deficit.  Skin: Skin is warm and dry.  Psychiatric: She has a normal mood and affect. Her speech is normal.    ED Course   Procedures (including critical care time)  Labs Reviewed - No data to display No results found. 1. Dental caries     MDM  Pt has a fracture of upper tooth extending into the gum. Neg for LUdwig's angina.  Pt to see a dentist as soon as possible. No fever noted. Rx for cleocin, diclofenac, and tramadol given to the patient.  Kathie Dike, PA-C 07/28/13 1321

## 2013-07-30 NOTE — ED Provider Notes (Signed)
Medical screening examination/treatment/procedure(s) were performed by non-physician practitioner and as supervising physician I was immediately available for consultation/collaboration.   William Fiorela Pelzer, MD 07/30/13 0738 

## 2013-12-05 ENCOUNTER — Emergency Department (HOSPITAL_COMMUNITY)
Admission: EM | Admit: 2013-12-05 | Discharge: 2013-12-05 | Disposition: A | Discharge status: Home / Self Care | Payer: Self-pay | Attending: Emergency Medicine | Admitting: Emergency Medicine

## 2013-12-05 ENCOUNTER — Encounter (HOSPITAL_COMMUNITY): Payer: Self-pay | Admitting: Emergency Medicine

## 2013-12-05 DIAGNOSIS — K089 Disorder of teeth and supporting structures, unspecified: Secondary | ICD-10-CM | POA: Insufficient documentation

## 2013-12-05 DIAGNOSIS — K029 Dental caries, unspecified: Secondary | ICD-10-CM | POA: Insufficient documentation

## 2013-12-05 DIAGNOSIS — Z79899 Other long term (current) drug therapy: Secondary | ICD-10-CM | POA: Insufficient documentation

## 2013-12-05 DIAGNOSIS — Z880 Allergy status to penicillin: Secondary | ICD-10-CM | POA: Insufficient documentation

## 2013-12-05 DIAGNOSIS — F3189 Other bipolar disorder: Secondary | ICD-10-CM | POA: Insufficient documentation

## 2013-12-05 DIAGNOSIS — Z791 Long term (current) use of non-steroidal anti-inflammatories (NSAID): Secondary | ICD-10-CM | POA: Insufficient documentation

## 2013-12-05 DIAGNOSIS — F17200 Nicotine dependence, unspecified, uncomplicated: Secondary | ICD-10-CM | POA: Insufficient documentation

## 2013-12-05 MED ORDER — CLINDAMYCIN HCL 150 MG PO CAPS: 150.0000 mg | ORAL_CAPSULE | Freq: Four times a day (QID) | ORAL | Status: DC

## 2013-12-05 MED ORDER — NAPROXEN 500 MG PO TABS: 500.0000 mg | ORAL_TABLET | Freq: Two times a day (BID) | ORAL | Status: DC

## 2013-12-05 MED ORDER — HYDROCODONE-ACETAMINOPHEN 5-325 MG PO TABS: 1.0000 | ORAL_TABLET | Freq: Four times a day (QID) | ORAL | Status: DC | PRN

## 2013-12-05 NOTE — ED Provider Notes (Signed)
CSN: [id number]     Arrival date & time 12/05/13  1442 History  This chart was scribed for non-physician practitioner Felicie Morn, NP working with Celene Kras, MD by Danella Maiers, ED Scribe. This patient was seen in room TR11C/TR11C and the patient's care was started at 4:12 PM.   Chief Complaint  Patient presents with  . Dental Pain   Patient is a 48 y.o. female presenting with tooth pain. The history is provided by the patient. No language interpreter was used.  Dental Pain Location:  Upper Quality:  Aching Severity:  Moderate Timing:  Constant Context: dental caries   Relieved by:  Nothing  HPI Comments: [name] is a 48 y.o. female who presents to the Emergency Department complaining of upper dental pain. Pt reports dental caries and missing teeth in the same area. She does not have a dentist because she does not have insurance. She is allergic to penicillin.    Past Medical History  Diagnosis Date  . Drug abuse   . Depression   . Bipolar 2 disorder   . Attention deficit hyperactivity disorder (ADHD)    Past Surgical History  Procedure Laterality Date  . Abdominal hysterectomy     History reviewed. No pertinent family history. History  Substance Use Topics  . Smoking status: Current Every Day Smoker -- 0.50 packs/day for 25 years    Types: Cigarettes  . Smokeless tobacco: Not on file  . Alcohol Use: No   OB History   Grav Para Term Preterm Abortions TAB SAB Ect Mult Living                 Review of Systems  HENT: Positive for dental problem.   All other systems reviewed and are negative.    Allergies  Compazine and Penicillins  Home Medications   Current Outpatient Rx  Name  Route  Sig  Dispense  Refill  . clindamycin (CLEOCIN) 150 MG capsule   Oral   Take 1 capsule (150 mg total) by mouth 3 (three) times daily.   21 capsule   0   . diclofenac (VOLTAREN) 75 MG EC tablet   Oral   Take 1 tablet (75 mg total) by mouth 2 (two) times daily.   14  tablet   0   . naproxen sodium (ALEVE) 220 MG tablet   Oral   Take 440 mg by mouth daily as needed (for pain).         Marland Kitchen sertraline (ZOLOFT) 100 MG tablet   Oral   Take 100 mg by mouth 2 (two) times daily.          . traMADol (ULTRAM) 50 MG tablet   Oral   Take 1 tablet (50 mg total) by mouth every 6 (six) hours as needed for pain.   15 tablet   0    BP 143/87  Pulse 108  Temp(Src) 98.6 F (37 C)  Resp 18  SpO2 98% Physical Exam  Nursing note and vitals reviewed. Constitutional: She is oriented to person, place, and time. She appears well-developed and well-nourished. No distress.  HENT:  Head: Normocephalic and atraumatic.  Mouth/Throat: Dental caries present.  Widespread gum decay  Eyes: EOM are normal.  Neck: Neck supple. No tracheal deviation present.  Cardiovascular: Normal rate.   Pulmonary/Chest: Effort normal. No respiratory distress.  Musculoskeletal: Normal range of motion.  Lymphadenopathy:    She has no cervical adenopathy.  Neurological: She is alert and oriented to person, place,  and time.  Skin: Skin is warm and dry.  Psychiatric: She has a normal mood and affect. Her behavior is normal.    ED Course  Procedures (including critical care time) Medications - No data to display  DIAGNOSTIC STUDIES: Oxygen Saturation is 98% on RA, normal by my interpretation.    COORDINATION OF CARE: 4:17 PM- Discussed treatment plan with pt which includes antibiotics, pain medication, and dentist referral. Pt agrees to plan.    Labs Review Labs Reviewed - No data to display Imaging Review No results found.  EKG Interpretation   None       MDM  Dental pain.  Antibiotic, NSAID. Dental referral provided.  I personally performed the services described in this documentation, which was scribed in my presence. The recorded information has been reviewed and is accurate.    Jimmye Normanavid [name] Josceline Chenard, NP 12/06/13 0140

## 2013-12-05 NOTE — ED Notes (Signed)
Follow up and prescriptions reviewed with pt. Pt verbalized understanding.

## 2013-12-05 NOTE — ED Notes (Signed)
Per pt upper dental pain where there are caries and missing teeth.

## 2013-12-05 NOTE — Discharge Instructions (Signed)
Dental Pain °A tooth ache may be caused by cavities (tooth decay). Cavities expose the nerve of the tooth to air and hot or cold temperatures. It may come from an infection or abscess (also called a boil or furuncle) around your tooth. It is also often caused by dental caries (tooth decay). This causes the pain you are having. °DIAGNOSIS  °Your caregiver can diagnose this problem by exam. °TREATMENT  °· If caused by an infection, it may be treated with medications which kill germs (antibiotics) and pain medications as prescribed by your caregiver. Take medications as directed. °· Only take over-the-counter or prescription medicines for pain, discomfort, or fever as directed by your caregiver. °· Whether the tooth ache today is caused by infection or dental disease, you should see your dentist as soon as possible for further care. °SEEK MEDICAL CARE IF: °The exam and treatment you received today has been provided on an emergency basis only. This is not a substitute for complete medical or dental care. If your problem worsens or new problems (symptoms) appear, and you are unable to meet with your dentist, call or return to this location. °SEEK IMMEDIATE MEDICAL CARE IF:  °· You have a fever. °· You develop redness and swelling of your face, jaw, or neck. °· You are unable to open your mouth. °· You have severe pain uncontrolled by pain medicine. °MAKE SURE YOU:  °· Understand these instructions. °· Will watch your condition. °· Will get help right away if you are not doing well or get worse. °Document Released: 11/19/2005 Document Revised: 02/11/2012 Document Reviewed: 07/07/2008 °ExitCare® Patient Information ©2014 [employer]. °  Emergency Department Resource Guide °1) Find a Doctor and Pay Out of Pocket °Although you won't have to find out who is covered by your insurance plan, it is a good idea to ask around and get recommendations. You will then need to call the office and see if the doctor you have chosen will  accept you as a new patient and what types of options they offer for patients who are self-pay. Some doctors offer discounts or will set up payment plans for their patients who do not have insurance, but you will need to ask so you aren't surprised when you get to your appointment. ° °2) Contact Your Local Health Department °Not all health departments have doctors that can see patients for sick visits, but many do, so it is worth a call to see if yours does. If you don't know where your local health department is, you can check in your phone book. The CDC also has a tool to help you locate your state's health department, and many state websites also have listings of all of their local health departments. ° °3) Find a Walk-in Clinic °If your illness is not likely to be very severe or complicated, you may want to try a walk in clinic. These are popping up all over the country in pharmacies, drugstores, and shopping centers. They're usually staffed by nurse practitioners or physician assistants that have been trained to treat common illnesses and complaints. They're usually fairly quick and inexpensive. However, if you have serious medical issues or chronic medical problems, these are probably not your best option. ° °No Primary Care Doctor: °- Call [facility] at  832-8000 - they can help you locate a primary care doctor that  accepts your insurance, provides certain services, etc. °- Physician Referral Service- 1-800-533-3463 ° °Chronic Pain Problems: °Organization           Address  Phone   Notes  °[facility]  (336) 297-2271 Patients need to be referred by their primary care doctor.  ° °Medication Assistance: °Organization         Address  Phone   Notes  °[place] [facility] [address] °[place], [place] [address] (336) 641-8030 --Must be a resident of [place] °-- Must have NO insurance coverage whatsoever (no Medicaid/ Medicare, etc.) °-- The pt.  MUST have a primary care doctor that directs their care regularly and follows them in the community °  °[facility]  (866) 331-1348   °[employer]  (888) 892-1162   ° °Agencies that provide inexpensive medical care: °Organization         Address  Phone   Notes  °[facility]  (336) 832-8035   °[facility]    (336) 832-7272   °[facility] [address] °[place], [place] [address] (336) 832-4777   °[facility] [address], °[place] (336) 271-4999   °[facility]    (336) 373-0678   °[facility]    (336) 272-1050   °[facility] ° [address], [place] Phone:  (336) 832-4444, Fax:  (336) 832-4440 Hours of Operation:  9 am - 6 pm, M-F.  Also accepts Medicaid/Medicare and self-pay.  °[facility] ° [address], [place] Phone: (336) 832-3150, Fax: (336) 832-3151. Hours of Operation:  8:30 am - 5:30 pm, M-F.  Also accepts Medicaid and self-pay.  °[facility] [place] [address], [place] Phone: (336) 878-6027   °[facility] [address], [place], [place] (336)723-1848, Ext. 123 Mondays & Thursdays: 7-9 AM.  First 15 patients are seen on a first come, first serve basis. °  ° °Medicaid-accepting [place] Providers: ° °Organization         Address  Phone   Notes  °[facility] [address], [place] (336) 641-2100 Also accepts self-pay patients.  °[facility] [address], [place] ° (336) 856-9996   °[facility] [address], [place] (336) 288-8857   °[facility] [address] [place] [address], [place] (336) 299-7000   °Veita Bland [address], [place]  ° (336) 373-1557 Only accepts [employer] Access Medicaid patients after they have their name applied to their card.  ° °Self-Pay (no insurance) in  [place]: ° °Organization         Address  Phone   Notes  °Sickle Cell Patients, [facility] [address], [place] (336) 832-1970   °[facility] [address], [place] (336) 832-4400   °[facility] [place] ° [address], [place] (336) 992-4800   °[facility]/Dr. Osei-Bonsu ° [address] [place] [address], [place] or [address], [place] (336) 841-8500 Phone number for both [place] and [place] locations is the same.  °[facility] [address], [place] (336) 299-0000   °[facility] [address] [place] [address], [place] or [address] (336) 852-7530 °(336) 878-2260   °[facility] [address],  (336) 350-1642, phone; (336) 294-5005, fax Sees patients 1st and 3rd Saturday of every month.  Must not qualify   for public or private insurance (i.e. Medicaid, Medicare, [place] Health Choice, Veterans' Benefits) • Household income should be no more than 200% of the poverty level •The clinic cannot treat you if you are pregnant or think you are pregnant • Sexually transmitted diseases are not treated at the clinic.  ° °Dental Care: °Organization         Address  Phone  Notes  °[facility] [address], [place] (336) 641-6152 Accepts children up to age 21 who are enrolled in Medicaid or [place] Health Choice; pregnant women with a Medicaid card; and children who have applied for Medicaid or [place] Health Choice, but were declined, whose parents can pay a reduced fee at time of service.  °[facility] [place]  [address], [place] (336) 641-7733 Accepts children up to age 21 who are enrolled in Medicaid or [place] Health Choice; pregnant women with a Medicaid card; and children who have applied for Medicaid or [place] Health Choice, but were declined, whose parents  can pay a reduced fee at time of service.  °[facility] ° [address], [place] (336) 641-4533 Patients are seen by appointment only. Walk-ins are not accepted. [facility] will see patients 18 years of age and older. °Monday - Tuesday (8am-5pm) °Most Wednesdays (8:30-5pm) °$30 per visit, cash only  °[facility] ° [address], [place] (336) 641-4533 Patients are seen by appointment only. Walk-ins are not accepted. [facility] will see patients 18 years of age and older. °One Wednesday Evening (Monthly: Volunteer Based).  $30 per visit, cash only  °[facility]  (919) 537-3737 for adults; Children under age 4, call Graduate Pediatric Dentistry at (919) [phone]. Children aged 4-14, please call (919) [phone] to request a pediatric application. ° Dental services are provided in all areas of dental care including fillings, crowns and bridges, complete and partial dentures, implants, gum treatment, root canals, and extractions. Preventive care is also provided. Treatment is provided to both adults and children. °Patients are selected via a lottery and there is often a waiting list. °  °[facility] [address], °[place] ° (336) 763-8833 [email or web address] °  °[facility] [address], [place], [place] (336)723-1848, Ext. 123 Second and Fourth Thursday of each month, opens at 6:30 AM; Clinic ends at 9 AM.  Patients are seen on a first-come first-served basis, and a limited number are seen during each clinic.  ° °[facility] ° [address], [place], [place] (336) 723-7904   Eligibility Requirements °You must have lived in [place], [place], or [place] counties for at least the last three months. °  You cannot be eligible for state or federal sponsored healthcare insurance, including Veterans Administration, Medicaid, or Medicare. °  You generally cannot be eligible for healthcare  insurance through your employer.  °  How to apply: °Eligibility screenings are held every Tuesday and Wednesday afternoon from 1:00 pm until 4:00 pm. You do not need an appointment for the interview!  °[facility] [address], [place], [place] 336-631-2330   °[facility]  336-342-8273   °[facility]  336-703-3100   °[facility]  336-570-6415   ° °Behavioral Health Resources in the Community: °Intensive Outpatient Programs °Organization         Address  Phone    Notes  °[facility] [address], [place], [place] 336-878-6098   °[facility] [address], [place], [place] 336-832-9800   °[facility] [address], [place], [place] ° 336-882-2125   °[facility] [address],  °[place], [place] 1-800-853-5163 or 336-641-4981   °Substance Abuse Resources °Organization         Address  Phone  Notes  °[facility]  336-882-2125   °[facility]  336-784-9470   °[facility]  336-285-9073   °[facility]  336-845-3988   °Residential & Outpatient Substance Abuse Program  1-800-659-3381   °Psychological Services °Organization         Address  Phone  Notes  °[facility]  336- 832-9600   °[facility]  336- 378-7881   °[facility] [address], [place] 1-800-853-5163 or 336-641-4981   ° °Mobile Crisis Teams °Organization         Address  Phone  Notes  °[facility], Mobile Crisis Care Unit  1-877-626-1772   °Assertive °[facility] ° [address] [place], [place] 336-834-9664   °Sharon DeEsch [address] °[place] 336-554-5454   ° °Self-Help/Support Groups °Organization         Address  Phone             Notes  °[facility] - variety of support groups  336- 373-1402 Call for more information  °Narcotics Anonymous (NA),  [facility] [address], °[place]  2 meetings at this location  ° °Residential Treatment Programs °Organization         Address  Phone  Notes  °[facility] [address],    °[place]  1-866-801-8205   °[facility] ° [address], [place], [place] 704-293-8524   °[facility] [address], [place] 336-845-3988 Admissions: 8am-3pm M-F  °[facility] [address]    °[place], [place] 336-841-1104   °[facility] [address], [place], [place] 336-379-7146   °[facility] [address]  °[place], [place] 336-285-9073   °[facility] - Intensive Outpatient [address], [place], [place] 336-852-3033   °[facility] [address]  °[place], [place] 1-877-615-2722 or 336-784-9470   °[facility] ([facility]) [address] [place], [place] 336-227-7417 Accepts Medicaid  °[facility] [address]  ° [place] 1-800-659-3381 Substance Abuse/Addiction Treatment  ° °[place] Behavioral Health Resources °Organization         Address  Phone  Notes  °[facility]  (888) 581-9988   °Julie Brannon, PhD [address] [place], [place]   (336) 349-5553 or (336) 951-0000   °[facility]   [address] °[place], [place] (336) 349-4454   °[facility] [address], [place], [place] (336) 342-8316 Insurance/Medicaid/sponsorship through [facility]  °[facility] [address]                                    [place], [place] (336) 342-8316 Therapy/tele-psych/case  °[facility] [address]  ° [place],  (336) 349-2233    °Dr. Arfeen  (336) 349-4544   °[facility] [place]  [employer] [place]   [facility] 1) [address], [place] °2) [address], [place] °3)  [address], [place] (336) 349-3220 °(336) 342-7768 ° °(336) 342-8140     °[place] [facility] (336) 342-1394 or (336) 342-3537 (After Hours)    ° °   °

## 2013-12-06 NOTE — ED Provider Notes (Signed)
Medical screening examination/treatment/procedure(s) were performed by non-physician practitioner and as supervising physician I was immediately available for consultation/collaboration.    Adore Kithcart R Elissa Grieshop, MD 12/06/13 1547 

## 2014-02-10 ENCOUNTER — Emergency Department (HOSPITAL_COMMUNITY)
Admission: EM | Admit: 2014-02-10 | Discharge: 2014-02-10 | Disposition: A | Discharge status: Home / Self Care | Payer: Self-pay | Attending: Emergency Medicine | Admitting: Emergency Medicine

## 2014-02-10 ENCOUNTER — Encounter (HOSPITAL_COMMUNITY): Payer: Self-pay | Admitting: Emergency Medicine

## 2014-02-10 DIAGNOSIS — F17200 Nicotine dependence, unspecified, uncomplicated: Secondary | ICD-10-CM | POA: Insufficient documentation

## 2014-02-10 DIAGNOSIS — F1910 Other psychoactive substance abuse, uncomplicated: Secondary | ICD-10-CM | POA: Insufficient documentation

## 2014-02-10 DIAGNOSIS — F3289 Other specified depressive episodes: Secondary | ICD-10-CM | POA: Insufficient documentation

## 2014-02-10 DIAGNOSIS — R630 Anorexia: Secondary | ICD-10-CM | POA: Insufficient documentation

## 2014-02-10 DIAGNOSIS — F329 Major depressive disorder, single episode, unspecified: Secondary | ICD-10-CM

## 2014-02-10 DIAGNOSIS — F909 Attention-deficit hyperactivity disorder, unspecified type: Secondary | ICD-10-CM | POA: Insufficient documentation

## 2014-02-10 DIAGNOSIS — R110 Nausea: Secondary | ICD-10-CM | POA: Insufficient documentation

## 2014-02-10 DIAGNOSIS — F3189 Other bipolar disorder: Secondary | ICD-10-CM | POA: Insufficient documentation

## 2014-02-10 DIAGNOSIS — F32A Depression, unspecified: Secondary | ICD-10-CM

## 2014-02-10 DIAGNOSIS — Z880 Allergy status to penicillin: Secondary | ICD-10-CM | POA: Insufficient documentation

## 2014-02-10 DIAGNOSIS — Z79899 Other long term (current) drug therapy: Secondary | ICD-10-CM | POA: Insufficient documentation

## 2014-02-10 MED ORDER — SERTRALINE HCL 100 MG PO TABS: 200.0000 mg | ORAL_TABLET | Freq: Every day | ORAL | Status: DC

## 2014-02-10 MED ORDER — ALPRAZOLAM 0.5 MG PO TABS: 0.5000 mg | ORAL_TABLET | Freq: Three times a day (TID) | ORAL | Status: DC | PRN

## 2014-02-10 MED ORDER — AMITRIPTYLINE HCL 50 MG PO TABS: 50.0000 mg | ORAL_TABLET | Freq: Every day | ORAL | Status: DC

## 2014-02-10 MED ORDER — ALPRAZOLAM 0.5 MG PO TABS
0.5000 mg | ORAL_TABLET | Freq: Once | ORAL | Status: AC
  Administered 2014-02-10: 0.5 mg via ORAL
  Filled 2014-02-10: qty 1

## 2014-02-10 NOTE — Discharge Instructions (Signed)
Depression, Adult °Depression is feeling sad, low, down in the dumps, blue, gloomy, or empty. In general, there are two kinds of depression: °· Normal sadness or grief. This can happen after something upsetting. It often goes away on its own within 2 weeks. After losing a loved one (bereavement), normal sadness and grief may last longer than two weeks. It usually gets better with time. °· Clinical depression. This kind lasts longer than normal sadness or grief. It keeps you from doing the things you normally do in life. It is often hard to function at home, work, or at school. It may affect your relationships with others. Treatment is often needed. °GET HELP RIGHT AWAY IF: °· You have thoughts about hurting yourself or others. °· You lose touch with reality (psychotic symptoms). You may: °· See or hear things that are not real. °· Have untrue beliefs about your life or people around you. °· Your medicine is giving you problems. °MAKE SURE YOU: °· Understand these instructions. °· Will watch your condition. °· Will get help right away if you are not doing well or get worse. °Document Released: 12/22/2010 Document Revised: 08/13/2012 Document Reviewed: 03/20/2012 °ExitCare® Patient Information ©2014 [employer]. ° °

## 2014-02-10 NOTE — ED Notes (Signed)
Per pt, has been off meds for mental disorder x 1 month.  [facility]  Notified therapist and advised to come here for medication treatment.  Pt has hx of SI.  Denies any SI/HI at this time.  Family with patient.  Zoloft, elavil, xanax, aderall.

## 2014-02-10 NOTE — ED Provider Notes (Signed)
CSN: [id number]     Arrival date & time 02/10/14  1147 History  This chart was scribed for non-physician practitioner Elpidio Anis working with Rolland Porter, MD by Elveria Rising, ED Scribe. This patient was seen in room WTR6/WTR6 and the patient's care was started at 12:19 PM.   Chief Complaint  Patient presents with  . Medication Refill      The history is provided by the patient. No language interpreter was used.   HPI Comments: [name] is a 48 y.o. female who presents to the Emergency Department requesting a refill for her psych medication. Patient says that she has been off medication that she has taken for 8 years for a month now and last week her daughter had a miscarriage and the patient is feeling quite overwhelmed. The patient states that medication treats her depression, bipolar disorder, ADHD and anxiety. Patient sister reports that the patient was being seen at a center in [facility], but the patient was not satisfied with the service there. For this month while the patient has been off of her medication, the patient's sister has attempted convincing patient to resume her treatment and has also been seeking another psychiatrist. The patient's PCP is in [place]. The patient sister reports that a psychiatrist here in [place], Dr. Dimas Aguas, is familiar with the patient's medication and will be able to provide a list if necessary. Patient reports decreased appetite and nausea since learning of her daughter's miscarriage. Patient sister's reports that lately the patient has been showing visible signs of depression. Patient additionally reports that she is not sleeping well, but that even on her medication she routinely gets 4-5 hours of sleep a night. Patient denies fever and vomiting. Patient  Patient denies suicidal or homicidal ideation, self-harm, hallucinations, and voices.   Past Medical History  Diagnosis Date  . Drug abuse   . Depression   . Bipolar 2 disorder   .  Attention deficit hyperactivity disorder (ADHD)    Past Surgical History  Procedure Laterality Date  . Abdominal hysterectomy     History reviewed. No pertinent family history. History  Substance Use Topics  . Smoking status: Current Every Day Smoker -- 0.50 packs/day for 25 years    Types: Cigarettes  . Smokeless tobacco: Not on file  . Alcohol Use: No   OB History   Grav Para Term Preterm Abortions TAB SAB Ect Mult Living                 Review of Systems  Constitutional: Positive for appetite change. Negative for fever.  Gastrointestinal: Positive for nausea. Negative for vomiting.  Psychiatric/Behavioral: Negative for suicidal ideas, hallucinations and self-injury.  All other systems reviewed and are negative.      Allergies  Compazine and Penicillins  Home Medications   Current Outpatient Rx  Name  Route  Sig  Dispense  Refill  . sertraline (ZOLOFT) 100 MG tablet   Oral   Take 100 mg by mouth 2 (two) times daily.           BP 141/91  Pulse 89  Temp(Src) 98.2 F (36.8 C) (Oral)  Resp 18  SpO2 100% Physical Exam  Nursing note and vitals reviewed. Constitutional: She is oriented to person, place, and time. She appears well-developed and well-nourished. No distress.  HENT:  Head: Normocephalic and atraumatic.  Eyes: EOM are normal.  Cardiovascular: Normal rate, regular rhythm and normal heart sounds.   Pulmonary/Chest: Effort normal and breath sounds normal. No  respiratory distress. She has no wheezes. She has no rales.  Musculoskeletal: Normal range of motion.  Neurological: She is alert and oriented to person, place, and time.  Skin: Skin is warm and dry.  Psychiatric: She has a normal mood and affect. Her behavior is normal.    ED Course  Procedures (including critical care time) DIAGNOSTIC STUDIES:     COORDINATION OF CARE: 12:23 PM- Pt advised of plan for treatment and pt agrees.    Labs Review Labs Reviewed - No data to display Imaging  Review No results found.   EKG Interpretation None      MDM   Final diagnoses:  None    1. Depression  She is here with sister and both are comfortable with patient's discharge home and prefer discharge. No SI currently. She does not feel she needs to be in the hospital and is going home with family. She will return if she starts to feel like self harm or suicidal. She has follow up with Dr. Dimas AguasHoward next week.   I personally performed the services described in this documentation, which was scribed in my presence. The recorded information has been reviewed and is accurate.     Arnoldo HookerShari A Biff Rutigliano, PA-C 02/10/14 1255

## 2014-02-11 NOTE — ED Provider Notes (Signed)
Medical screening examination/treatment/procedure(s) were performed by non-physician practitioner and as supervising physician I was immediately available for consultation/collaboration.   EKG Interpretation None        Aleila Syverson, MD 02/11/14 1056 

## 2014-05-25 IMAGING — CT CT ABD-PELV W/O CM
2 of 4 series · 16 of 46 positions shown, 18 images · non-contrast
Comparison: 07/20/2011

CLINICAL DATA: Left flank pain

CT ABDOMEN AND PELVIS WITHOUT CONTRAST
TECHNIQUE: Multidetector CT imaging of the abdomen and pelvis was
performed following the standard protocol without intravenous
contrast.

[Series 2: renal stone > 200 lbs 5.0 b31f · axial · 0.73mm/px · z∈[+767,+1197]mm · 13 of 94 slices shown, 15 images]
[im 4/94  soft-tissue]
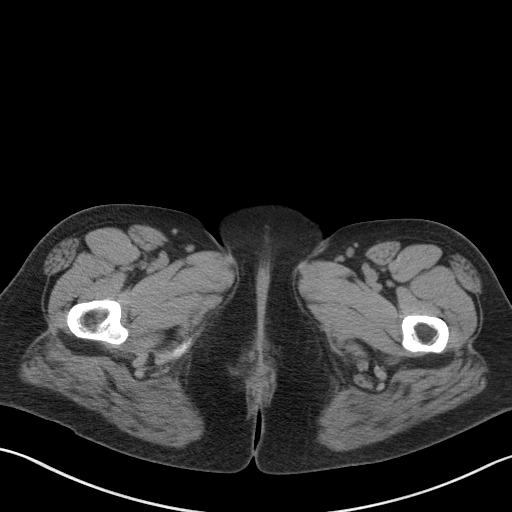
[im 4/94  bone]
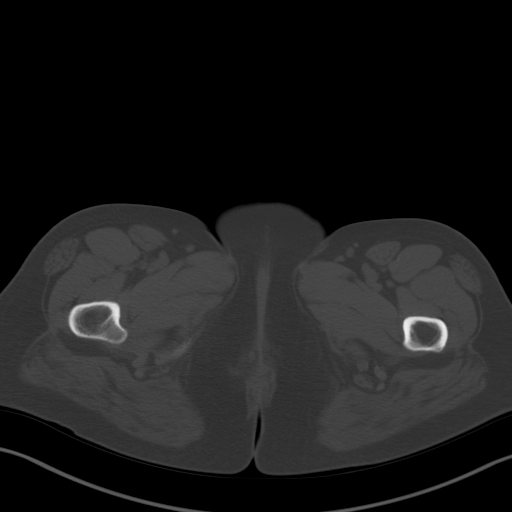
[im 12/94  soft-tissue]
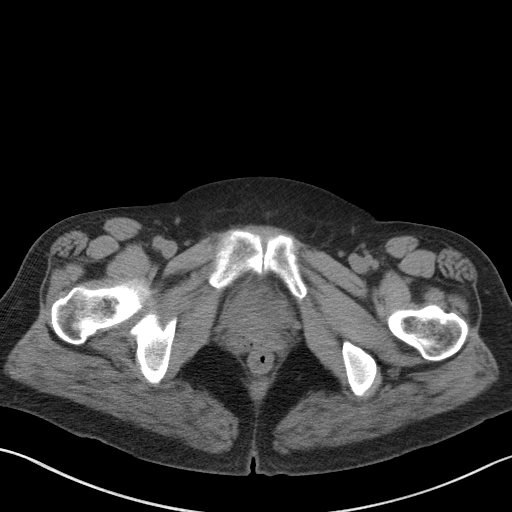
[im 20/94  soft-tissue]
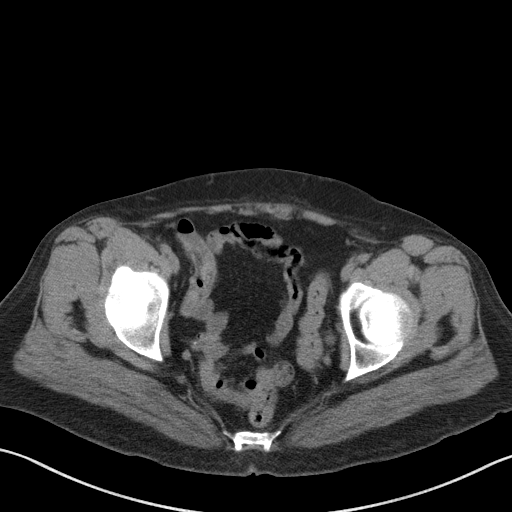
[im 28/94  soft-tissue]
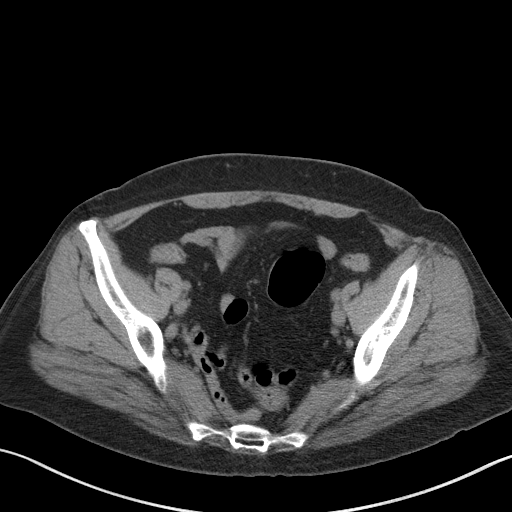
[im 32/94  soft-tissue]
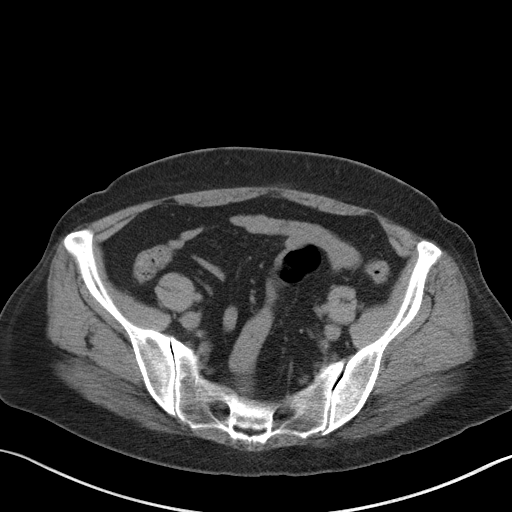
[im 39/94  soft-tissue]
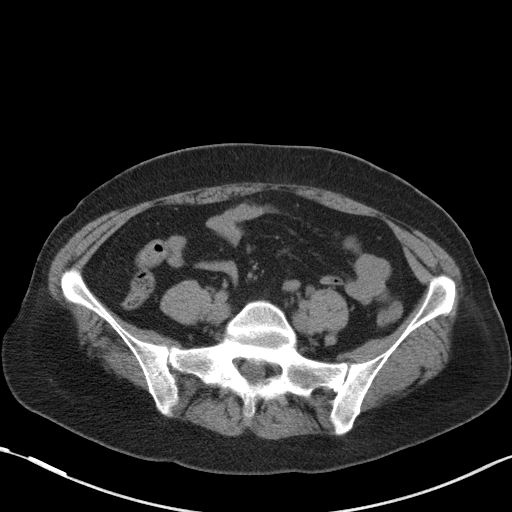
[im 47/94  soft-tissue]
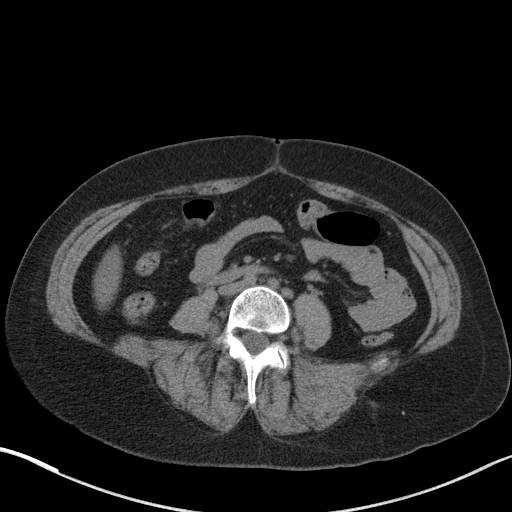
[im 55/94  soft-tissue]
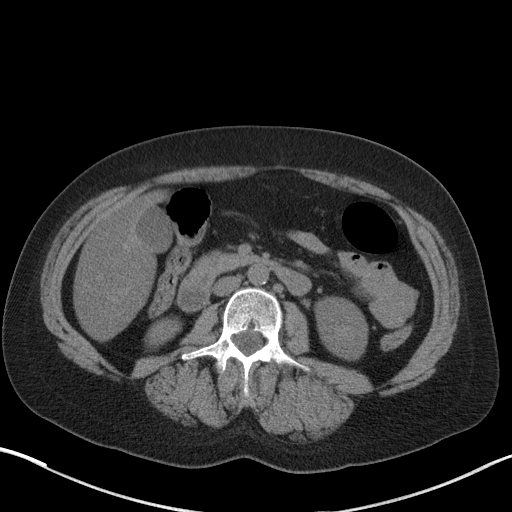
[im 63/94  soft-tissue]
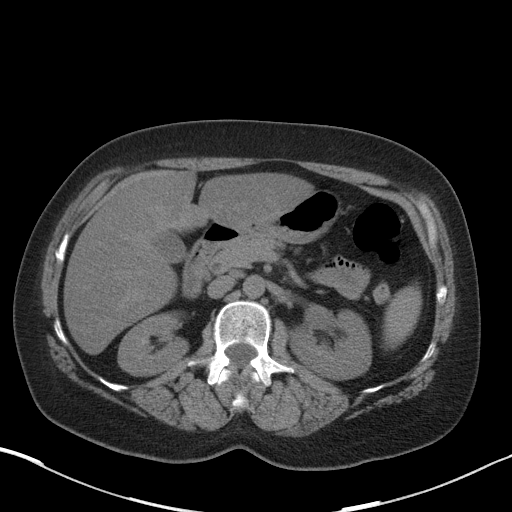
[im 63/94  bone]
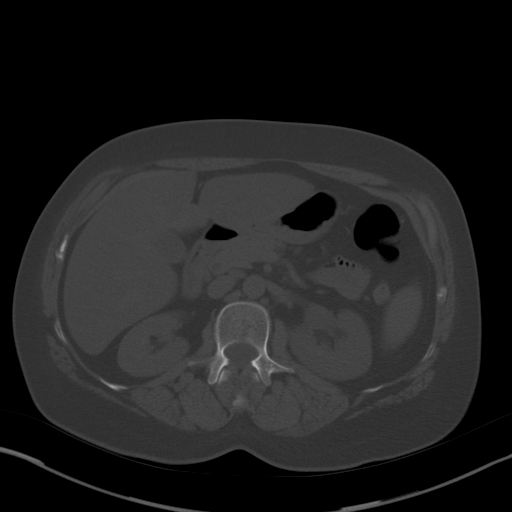
[im 66/94  soft-tissue]
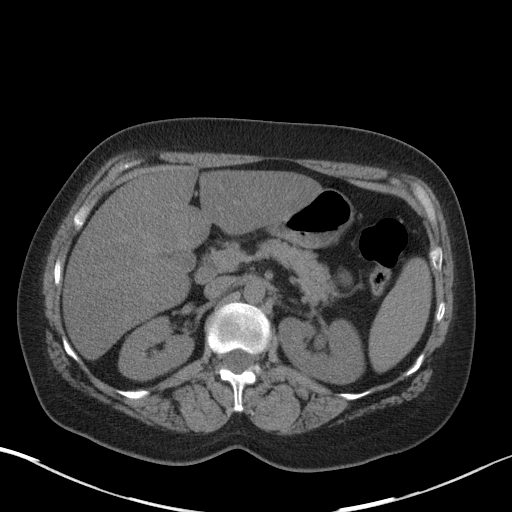
[im 74/94  soft-tissue]
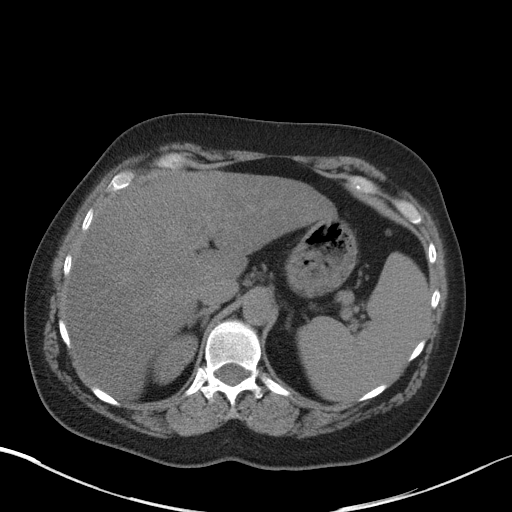
[im 82/94  soft-tissue]
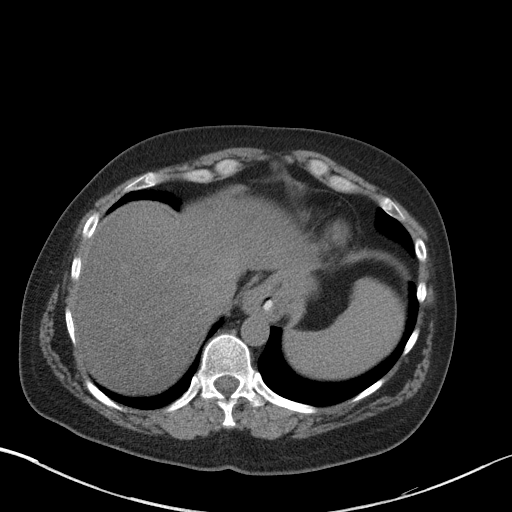
[im 90/94  soft-tissue]
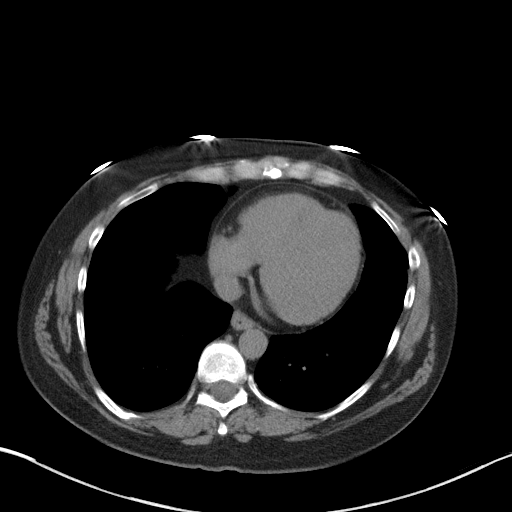

[Series 5: renal stone 3.0 coronal · coronal · 0.69mm/px · 3 of 84 slices shown]
[im 28/84  soft-tissue]
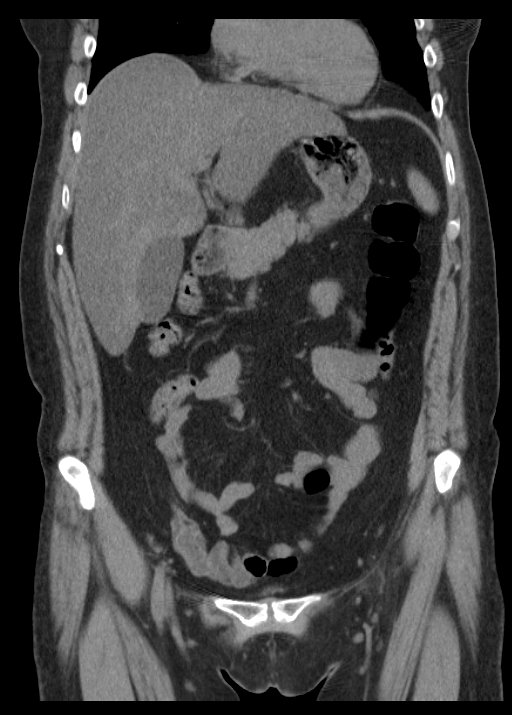
[im 37/84  soft-tissue]
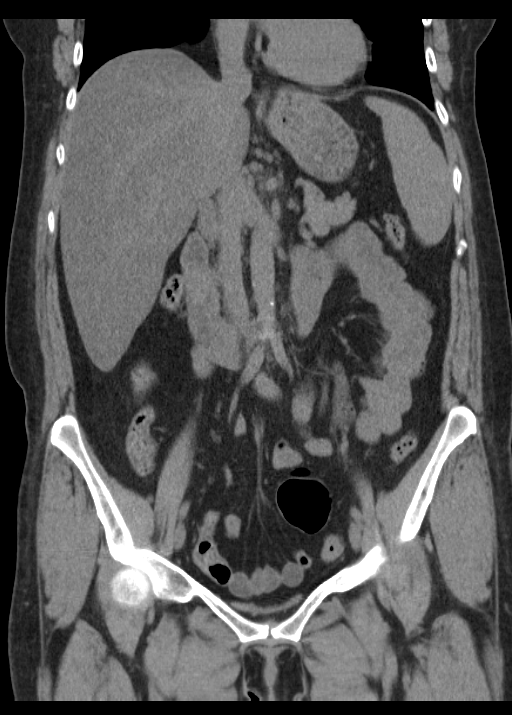
[im 47/84  soft-tissue]
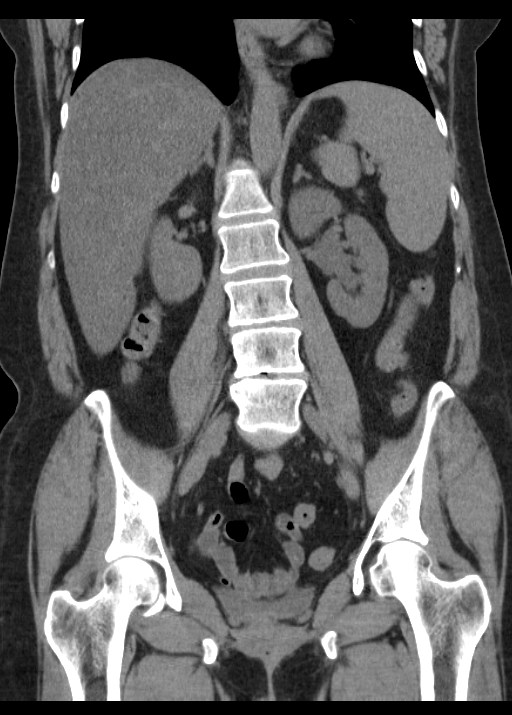

[16 of 46 positions shown; findings below may reference images not displayed]

FINDINGS: Cysts versus bullae at the left lung base.  Heart size
within normal limits.  No pleural or pericardial effusion.  Small
hiatal hernia.

Organ abnormality/lesion detection is limited in the absence of
intravenous contrast. Within this limitation, diffuse low
attenuation of the liver, in keeping with fatty infiltration.
Unremarkable biliary system, spleen, pancreas, adrenal glands.

Symmetric renal size.  There is mild hydroureteronephrosis on the
left to the level of a 2 mm stone at the left UVJ.

No bowel obstruction.  No CT evidence for colitis.  Normal
appendix.  No free intraperitoneal air or fluid.  No
lymphadenopathy.

Normal caliber aorta and branch vessels.

Decompressed bladder.  Absent uterus.  No adnexal mass.

No acute osseous finding. L4-5 degenerative disc disease with disc
bulge resulting in mild to moderate central canal narrowing.
Lumbosacral facet arthropathy.
IMPRESSION: Mild left hydroureteronephrosis to the level of a 2 mm left UVJ
stone.

Hepatic steatosis.

Small hiatal hernia.

## 2015-07-26 ENCOUNTER — Encounter (HOSPITAL_COMMUNITY): Payer: Self-pay

## 2015-07-26 ENCOUNTER — Emergency Department (HOSPITAL_COMMUNITY)
Admission: EM | Admit: 2015-07-26 | Discharge: 2015-07-26 | Disposition: A | Discharge status: Home / Self Care | Payer: Self-pay | Attending: Emergency Medicine | Admitting: Emergency Medicine

## 2015-07-26 DIAGNOSIS — Z79899 Other long term (current) drug therapy: Secondary | ICD-10-CM | POA: Insufficient documentation

## 2015-07-26 DIAGNOSIS — Z720 Tobacco use: Secondary | ICD-10-CM | POA: Insufficient documentation

## 2015-07-26 DIAGNOSIS — F319 Bipolar disorder, unspecified: Secondary | ICD-10-CM | POA: Insufficient documentation

## 2015-07-26 DIAGNOSIS — K002 Abnormalities of size and form of teeth: Secondary | ICD-10-CM | POA: Insufficient documentation

## 2015-07-26 DIAGNOSIS — K0889 Other specified disorders of teeth and supporting structures: Secondary | ICD-10-CM

## 2015-07-26 DIAGNOSIS — K088 Other specified disorders of teeth and supporting structures: Secondary | ICD-10-CM | POA: Insufficient documentation

## 2015-07-26 DIAGNOSIS — Z880 Allergy status to penicillin: Secondary | ICD-10-CM | POA: Insufficient documentation

## 2015-07-26 MED ORDER — CLINDAMYCIN HCL 150 MG PO CAPS: 300.0000 mg | ORAL_CAPSULE | Freq: Three times a day (TID) | ORAL | Status: DC

## 2015-07-26 MED ORDER — NAPROXEN 500 MG PO TABS: 500.0000 mg | ORAL_TABLET | Freq: Two times a day (BID) | ORAL | Status: DC

## 2015-07-26 NOTE — ED Provider Notes (Signed)
CSN: [id number]     Arrival date & time 07/26/15  1008 History   First MD Initiated Contact with Patient 07/26/15 1012     Chief Complaint  Patient presents with  . Dental Pain     (Consider location/radiation/quality/duration/timing/severity/associated sxs/prior Treatment) The history is provided by the patient and medical records.    This is a 49 y.o. F with hx of depression, bipolar disorder, ADHD, drug abuse, presenting to the ED for dental pain.  Patient states upper left lateral incisor broke a few months ago but has not been giving her trouble until the past 2 days.  She reports throbbing pain that radiates into her left cheek.  No numbness or paresthesias of face.  No facial or neck swelling.  No difficulty swallowing.  No fever, chills. No intervention tried PTA.  Patient not established with dentist.    Past Medical History  Diagnosis Date  . Drug abuse   . Depression   . Bipolar 2 disorder   . Attention deficit hyperactivity disorder (ADHD)    Past Surgical History  Procedure Laterality Date  . Abdominal hysterectomy     Family History  Problem Relation Age of Onset  . Cancer Mother   . Heart failure Father    Social History  Substance Use Topics  . Smoking status: Current Every Day Smoker -- 0.50 packs/day for 25 years    Types: Cigarettes  . Smokeless tobacco: Never Used  . Alcohol Use: No   OB History    No data available     Review of Systems  HENT: Positive for dental problem.   All other systems reviewed and are negative.     Allergies  Compazine and Penicillins  Home Medications   Prior to Admission medications   Medication Sig Start Date End Date Taking? Authorizing Provider  ALPRAZolam Prudy Feeler) 0.5 MG tablet Take 1 tablet (0.5 mg total) by mouth 3 (three) times daily as needed for anxiety. 02/10/14   Elpidio Anis, PA-C  amitriptyline (ELAVIL) 50 MG tablet Take 1 tablet (50 mg total) by mouth at bedtime. 02/10/14   Elpidio Anis, PA-C   sertraline (ZOLOFT) 100 MG tablet Take 100 mg by mouth 2 (two) times daily.     Historical Provider, MD  sertraline (ZOLOFT) 100 MG tablet Take 2 tablets (200 mg total) by mouth daily. 02/10/14   Shari Upstill, PA-C   BP 121/81 mmHg  Pulse 88  Temp(Src) 98.3 F (36.8 C) (Oral)  Resp 18  Ht  (1.702 m)  Wt 160 lb (72.576 kg)  BMI 25.05 kg/m2  SpO2 100%   Physical Exam  Constitutional: She is oriented to person, place, and time. She appears well-developed and well-nourished.  HENT:  Head: Normocephalic and atraumatic.  Mouth/Throat: Oropharynx is clear and moist.  Teeth largely in very poor dentition, left upper lateral incisor broken and flush with gumline, surrounding gingiva swollen and discolored, handling secretions appropriately, no trismus; no facial or neck swelling  Eyes: Conjunctivae and EOM are normal. Pupils are equal, round, and reactive to light.  Neck: Normal range of motion.  Cardiovascular: Normal rate, regular rhythm and normal heart sounds.   Pulmonary/Chest: Effort normal and breath sounds normal.  Abdominal: Soft. Bowel sounds are normal.  Musculoskeletal: Normal range of motion.  Neurological: She is alert and oriented to person, place, and time.  Skin: Skin is warm and dry.  Psychiatric: She has a normal mood and affect.  Nursing note and vitals reviewed.   ED Course  Procedures (including critical care time) Labs Review Labs Reviewed - No data to display  Imaging Review No results found. I have personally reviewed and evaluated these images and lab results as part of my medical decision-making.   EKG Interpretation None      MDM   Final diagnoses:  Pain, dental   Dental pain with concern for developing dental abscess.  No appreciable fluid collection noted today.  Patient afebrile, nontoxic. No facial or neck swelling to suggest Ludwig's angina.  She'll be started on antibiotics and referred to dentist.  Discussed plan with patient, he/she  acknowledged understanding and agreed with plan of care.  Return precautions given for new or worsening symptoms.  Garlon Hatchet, PA-C 07/26/15 1032  Lavera Guise, MD 07/26/15 [phone]

## 2015-07-26 NOTE — Discharge Instructions (Signed)
Take the prescribed medication as directed. Follow-up with Dr. Michiel Sites-- call to make appt. Return to the ED for new or worsening symptoms.   Emergency Department Resource Guide 1) Find a Doctor and Pay Out of Pocket Although you won't have to find out who is covered by your insurance plan, it is a good idea to ask around and get recommendations. You will then need to call the office and see if the doctor you have chosen will accept you as a new patient and what types of options they offer for patients who are self-pay. Some doctors offer discounts or will set up payment plans for their patients who do not have insurance, but you will need to ask so you aren't surprised when you get to your appointment.  2) Contact Your Local Health Department Not all health departments have doctors that can see patients for sick visits, but many do, so it is worth a call to see if yours does. If you don't know where your local health department is, you can check in your phone book. The CDC also has a tool to help you locate your state's health department, and many state websites also have listings of all of their local health departments.  3) Find a Walk-in Clinic If your illness is not likely to be very severe or complicated, you may want to try a walk in clinic. These are popping up all over the country in pharmacies, drugstores, and shopping centers. They're usually staffed by nurse practitioners or physician assistants that have been trained to treat common illnesses and complaints. They're usually fairly quick and inexpensive. However, if you have serious medical issues or chronic medical problems, these are probably not your best option.  No Primary Care Doctor: - Call Health Connect at  [phone] - they can help you locate a primary care doctor that  accepts your insurance, provides certain services, etc. - Physician Referral Service- [phone]  Chronic Pain Problems: Organization          Address  Phone   Notes  [facility]  [phone] Patients need to be referred by their primary care doctor.   Medication Assistance: Organization         Address  Phone   Notes  [facility] [address] [place] [place], [place] [address] [phone] --Must be a resident of [facility] -- Must have NO insurance coverage whatsoever (no Medicaid/ Medicare, etc.) -- The pt. MUST have a primary care doctor that directs their care regularly and follows them in the community   [facility]  [phone]   [employer]  [phone]    Agencies that provide inexpensive medical care: Organization         Address  Phone   Notes  [facility]  [phone]   [facility]    [phone]   [facility] [address] [place], [place] [address] [phone]   [facility] [address] [place], [place] [phone]   [facility]anned Parenthood    [phone]   [facility]    [phone]   [facility]  [address], [place] Phone:  [phone], Fax:  [phone] Hours of Operation:  9 am - 6 pm, M-F.  Also accepts Medicaid/Medicare and self-pay.  [facility]  [address], [place] Phone: [phone]  [phone], Fax: [phone]. Hours of Operation:  8:30 am - 5:30 pm, M-F.  Also accepts Medicaid and self-pay.  [facility] [place] [address], [place] Phone: [phone]   [place], [place], [place] [phone], Ext. [phone] Mondays & Thursdays: 7-9 AM.  First 15 patients are seen on a first come, first serve basis.    [place] Providers:  Organization         Address  Phone   Notes  [facility] [address], [place] [phone] Also accepts self-pay patients.  [facility] [address], [place]  [phone]   [place], [address], [place] [phone]   [facility] [address] [place] [phone]   Lucianne Lei [address], [place]   [phone] Only accepts [place] Access [place] patients after they have their name applied to their card.   Self-Pay (no insurance) in [facility]:  Organization         Address  Phone   Notes  Sickle Cell Patients, [facility] [place] [phone]   [facility] [place] [phone]   [facility] [place]  [place], [place], [place] [phone]   [facility]/Dr. Osei-Bonsu  [address] [place] or [address], [place] [phone] Phone number for both [place] and [place] locations is the same.  [facility] [address] [place] [phone]   [facility] [address], [place] or [address] [phone]   [facility] [address] [place] [phone], phone; [phone], fax Sees patients 1st and 3rd Saturday of every month.  Must not qualify for public or private insurance (i.e. Medicaid, Medicare, [place] Health Choice, Veterans' Benefits)  Household income should be no more than 200% of the poverty level The clinic cannot treat you if you are pregnant or think you are pregnant  Sexually transmitted diseases are not treated at the clinic.    Dental Care: Organization         Address  Phone  Notes  [name] [place] [phone] Accepts children up to age 23 who are enrolled in [place] or [place]; pregnant women with a Medicaid card; and  children who have applied for Medicaid or [place] Health Choice, but were declined, whose parents can pay a reduced fee at time of service.  [facility] of [facility]  [address], [place] [phone] Accepts children up to age 22 who are enrolled in [place] or [place]; pregnant women with a Medicaid card; and children who have applied for Medicaid or [place] Health Choice, but were declined, whose parents can pay a reduced fee at time of service.  [place]  [place] [phone] Patients are seen by appointment only. Walk-ins are not accepted. [place] will see patients 70 years of age and older. Monday - Tuesday (8am-5pm) Most Wednesdays (8:30-5pm) $30 per visit, cash only  [facility]  [address], [employer]  [place] [phone] Patients are seen by appointment only. Walk-ins are not accepted. [facility] will see patients 18 years of age and older. °One Wednesday Evening (Monthly: Volunteer Based).  $30 per visit, cash only  °[facility]  [phone] for adults; Children under age 4, call Graduate Pediatric Dentistry at [phone]. Children aged 4-14, please call [phone] to request a pediatric application. ° Dental services are provided in all areas of dental care including fillings, crowns and bridges, complete and partial dentures, implants, gum treatment, root canals, and extractions. Preventive care is also provided. Treatment is provided to both adults and children. °Patients are selected via a lottery and there is often a waiting list. °  °[facility] [address], °[place] ° ([phone] [email or web address].[email or web address] °  °[facility] [address], [place], [place] ([phone], Ext. [phone] Second and Fourth Thursday of each month, opens at 6:30 AM; Clinic ends at 9 AM.  Patients are seen on a first-come first-served  basis, and a limited number are seen during each clinic.  ° °[facility] ° [address], [place], [place] [phone]   Eligibility Requirements °You must have lived in [place], [place], or [place] counties for at least the last three months. °  You cannot be eligible for state or federal sponsored healthcare insurance, including Veterans Administration, Medicaid, or Medicare. °  You generally cannot be eligible for healthcare insurance through your employer.  °  How to apply: °Eligibility screenings are held every Tuesday and Wednesday afternoon from 1:00 pm until 4:00 pm. You do not need an appointment for the interview!  °[facility] [address], [place], [place] [phone]   °[facility]  [phone]   °[facility]  [phone]   °[facility]  336-570-6415   ° °Behavioral Health Resources in the Community: °Intensive Outpatient Programs °Organization         Address  Phone  Notes  °[facility] [address], [place], [place] [phone]   °[facility] [address], [place], [place] [phone]   °[facility] [address], [place], [place] ° 336-882-2125   °[facility] [address],  °[place], [place] 1-800-853-5163 or 336-641-4981   °Substance Abuse Resources °Organization         Address  Phone  Notes  °[facility]  336-882-2125   °[facility]  336-784-9470   °[facility]  336-285-9073   °[facility]  336-845-3988   °Residential & Outpatient Substance Abuse Program  1-800-659-3381   °Psychological Services °Organization         Address  Phone  Notes  °[facility]  336- 832-9600   °[facility]  336- 378-7881   °[facility] [address], [place] 1-800-853-5163 or 336-641-4981   ° °Mobile Crisis Teams °Organization          Address  Phone  Notes  °[facility], Mobile Crisis Care Unit  1-877-626-1772   °Assertive °Psychotherapeutic Services ° [address] [place], [place] 336-834-9664   °Sharon DeEsch [address] °[place] 336-554-5454   ° °Self-Help/Support Groups °Organization         Address  Phone             Notes  °[facility] [place] - variety of support groups    336- H3156881 Call for more information  Narcotics Anonymous (NA), [facility] [address], [employer] [place]  2 meetings at this location   Residential Facilities manager         Address  Phone  Notes  [facility] [place],    [place]  1-681-042-6357   [facility]  [address], [place] [id number], [place], [place]   [place], [place] [phone] Admissions: 8am-3pm M-F  [facility]cent[facility] Substance [place] [address]    [employer], [place] [phone]   [facility] [address] [place], [place], [place]   [facility] [address],  [place], [place]   [facility] - Intensive Outpatient [address], [place], [place]   [facility] ([place].,  [place], [place] 1-(336)590-7905 or [phone]   [facility] ([facility]) [address], [place], [place] Accepts Medicaid  [facility] [address]  [place] 1-(661)[phone] Substance Abuse/Addiction Treatment   [facility] Organization         Address  Phone  Notes  [facility]  512-005-4881   Domenic Schwab, PhD [address] Arlis Porta [place], [place]   [phone] or [phone]   [place], [place] [phone]   [facility] [address], [place], [place] [phone] Insurance/Medicaid/sponsorship  through [facility] [address]                                    [place], [place] [phone] [place] [address], [place] [phone]    Dr. Adele Schilder  [phone]   [facility] Dept. 1) [address] [place] 2) [place] 3)  [place] [address], [place] [phone]  [phone]   [place] [phone] or [phone] (After Hours)

## 2015-07-26 NOTE — ED Notes (Signed)
Left upper tooth broken in the gum x 2 days.

## 2016-03-22 ENCOUNTER — Encounter (HOSPITAL_COMMUNITY): Payer: Self-pay

## 2016-03-22 ENCOUNTER — Emergency Department (HOSPITAL_COMMUNITY): Payer: Self-pay

## 2016-03-22 ENCOUNTER — Emergency Department (HOSPITAL_COMMUNITY)
Admission: EM | Admit: 2016-03-22 | Discharge: 2016-03-22 | Disposition: A | Discharge status: Home / Self Care | Payer: Self-pay | Attending: Emergency Medicine | Admitting: Emergency Medicine

## 2016-03-22 DIAGNOSIS — F17210 Nicotine dependence, cigarettes, uncomplicated: Secondary | ICD-10-CM | POA: Insufficient documentation

## 2016-03-22 DIAGNOSIS — K8020 Calculus of gallbladder without cholecystitis without obstruction: Secondary | ICD-10-CM | POA: Insufficient documentation

## 2016-03-22 DIAGNOSIS — R1013 Epigastric pain: Secondary | ICD-10-CM

## 2016-03-22 DIAGNOSIS — K2970 Gastritis, unspecified, without bleeding: Secondary | ICD-10-CM | POA: Insufficient documentation

## 2016-03-22 DIAGNOSIS — Z90710 Acquired absence of both cervix and uterus: Secondary | ICD-10-CM | POA: Insufficient documentation

## 2016-03-22 DIAGNOSIS — F329 Major depressive disorder, single episode, unspecified: Secondary | ICD-10-CM | POA: Insufficient documentation

## 2016-03-22 LAB — URINALYSIS, ROUTINE W REFLEX MICROSCOPIC
Bilirubin Urine: NEGATIVE
GLUCOSE, UA: 100 mg/dL — AB
Ketones, ur: 15 mg/dL — AB
LEUKOCYTES UA: NEGATIVE
Nitrite: NEGATIVE
PH: 6.5 (ref 5.0–8.0)
SPECIFIC GRAVITY, URINE: 1.01 (ref 1.005–1.030)

## 2016-03-22 LAB — COMPREHENSIVE METABOLIC PANEL
ALBUMIN: 4.3 g/dL (ref 3.5–5.0)
ALK PHOS: 106 U/L (ref 38–126)
ALT: 201 U/L — AB (ref 14–54)
AST: 165 U/L — AB (ref 15–41)
Anion gap: 9 (ref 5–15)
BILIRUBIN TOTAL: 0.4 mg/dL (ref 0.3–1.2)
BUN: 8 mg/dL (ref 6–20)
CALCIUM: 9.5 mg/dL (ref 8.9–10.3)
CO2: 27 mmol/L (ref 22–32)
CREATININE: 0.74 mg/dL (ref 0.44–1.00)
Chloride: 105 mmol/L (ref 101–111)
GFR calc Af Amer: >60 mL/min (ref >60)
GFR calc non Af Amer: >60 mL/min (ref >60)
GLUCOSE: 81 mg/dL (ref 65–99)
Potassium: 4 mmol/L (ref 3.5–5.1)
Sodium: 141 mmol/L (ref 135–145)
TOTAL PROTEIN: 8.2 g/dL — AB (ref 6.5–8.1)

## 2016-03-22 LAB — CBC
HCT: 45.3 % (ref 36.0–46.0)
Hemoglobin: 14.5 g/dL (ref 12.0–15.0)
MCH: 27 pg (ref 26.0–34.0)
MCHC: 32 g/dL (ref 30.0–36.0)
MCV: 84.2 fL (ref 78.0–100.0)
PLATELETS: 205 10*3/uL (ref 150–400)
RBC: 5.38 MIL/uL — ABNORMAL HIGH (ref 3.87–5.11)
RDW: 14.4 % (ref 11.5–15.5)
WBC: 6.7 10*3/uL (ref 4.0–10.5)

## 2016-03-22 LAB — URINE MICROSCOPIC-ADD ON

## 2016-03-22 LAB — LIPASE, BLOOD: Lipase: 52 U/L — ABNORMAL HIGH (ref 11–51)

## 2016-03-22 MED ORDER — HYDROCODONE-ACETAMINOPHEN 5-325 MG PO TABS: 1.0000 | ORAL_TABLET | ORAL | Status: DC | PRN

## 2016-03-22 MED ORDER — ONDANSETRON HCL 4 MG/2ML IJ SOLN
4.0000 mg | Freq: Once | INTRAMUSCULAR | Status: AC
  Administered 2016-03-22: 4 mg via INTRAVENOUS
  Filled 2016-03-22: qty 2

## 2016-03-22 MED ORDER — SUCRALFATE 1 G PO TABS: 1.0000 g | ORAL_TABLET | Freq: Four times a day (QID) | ORAL | Status: DC

## 2016-03-22 MED ORDER — ONDANSETRON 4 MG PO TBDP: 4.0000 mg | ORAL_TABLET | Freq: Three times a day (TID) | ORAL | Status: DC | PRN

## 2016-03-22 MED ORDER — PANTOPRAZOLE SODIUM 40 MG IV SOLR
40.0000 mg | Freq: Once | INTRAVENOUS | Status: AC
  Administered 2016-03-22: 40 mg via INTRAVENOUS
  Filled 2016-03-22: qty 40

## 2016-03-22 MED ORDER — MORPHINE SULFATE (PF) 4 MG/ML IV SOLN
4.0000 mg | INTRAVENOUS | Status: DC | PRN
  Administered 2016-03-22: 4 mg via INTRAVENOUS
  Filled 2016-03-22: qty 1

## 2016-03-22 MED ORDER — OMEPRAZOLE 20 MG PO CPDR: 20.0000 mg | DELAYED_RELEASE_CAPSULE | Freq: Two times a day (BID) | ORAL | Status: DC

## 2016-03-22 MED ORDER — SODIUM CHLORIDE 0.9 % IV BOLUS (SEPSIS)
1000.0000 mL | Freq: Once | INTRAVENOUS | Status: AC
  Administered 2016-03-22: 1000 mL via INTRAVENOUS

## 2016-03-22 NOTE — Discharge Instructions (Signed)
Abdominal Pain, Adult °Many things can cause abdominal pain. Usually, abdominal pain is not caused by a disease and will improve without treatment. It can often be observed and treated at home. Your health care provider will do a physical exam and possibly order blood tests and X-rays to help determine the seriousness of your pain. However, in many cases, more time must pass before a clear cause of the pain can be found. Before that point, your health care provider may not know if you need more testing or further treatment. °HOME CARE INSTRUCTIONS °Monitor your abdominal pain for any changes. The following actions may help to alleviate any discomfort you are experiencing: °· Only take over-the-counter or prescription medicines as directed by your health care provider. °· Do not take laxatives unless directed to do so by your health care provider. °· Try a clear liquid diet (broth, tea, or water) as directed by your health care provider. Slowly move to a bland diet as tolerated. °SEEK MEDICAL CARE IF: °· You have unexplained abdominal pain. °· You have abdominal pain associated with nausea or diarrhea. °· You have pain when you urinate or have a bowel movement. °· You experience abdominal pain that wakes you in the night. °· You have abdominal pain that is worsened or improved by eating food. °· You have abdominal pain that is worsened with eating fatty foods. °· You have a fever. °SEEK IMMEDIATE MEDICAL CARE IF: °· Your pain does not go away within 2 hours. °· You keep throwing up (vomiting). °· Your pain is felt only in portions of the abdomen, such as the right side or the left lower portion of the abdomen. °· You pass bloody or black tarry stools. °MAKE SURE YOU: °· Understand these instructions. °· Will watch your condition. °· Will get help right away if you are not doing well or get worse. °  °This information is not intended to replace advice given to you by your health care provider. Make sure you discuss  any questions you have with your health care provider. °  °Document Released: 08/29/2005 Document Revised: 08/10/2015 Document Reviewed: 07/29/2013 °Elsevier Interactive Patient Education ©2016 [employer] Inc. ° °Cholelithiasis °Cholelithiasis (also called gallstones) is a form of gallbladder disease in which gallstones form in your gallbladder. The gallbladder is an organ that stores bile made in the liver, which helps digest fats. Gallstones begin as small crystals and slowly grow into stones. Gallstone pain occurs when the gallbladder spasms and a gallstone is blocking the duct. Pain can also occur when a stone passes out of the duct.  °RISK FACTORS °· Being female.   °· Having multiple pregnancies. Health care providers sometimes advise removing diseased gallbladders before future pregnancies.   °· Being obese. °· Eating a diet heavy in fried foods and fat.   °· Being older than 60 years and increasing age.   °· Prolonged use of medicines containing female hormones.   °· Having diabetes mellitus.   °· Rapidly losing weight.   °· Having a family history of gallstones (heredity).   °SYMPTOMS °· Nausea.   °· Vomiting. °· Abdominal pain.   °· Yellowing of the skin (jaundice).   °· Sudden pain. It may persist from several minutes to several hours. °· Fever.   °· Tenderness to the touch.  °In some cases, when gallstones do not move into the bile duct, people have no pain or symptoms. These are called "silent" gallstones.  °TREATMENT °Silent gallstones do not need treatment. In severe cases, emergency surgery may be required. Options for treatment include: °· Surgery to   remove the gallbladder. This is the most common treatment. °· Medicines. These do not always work and may take 6-12 months or more to work. °· Shock wave treatment (extracorporeal biliary lithotripsy). In this treatment an ultrasound machine sends shock waves to the gallbladder to break gallstones into smaller pieces that can pass into the intestines or  be dissolved by medicine. °HOME CARE INSTRUCTIONS  °· Only take over-the-counter or prescription medicines for pain, discomfort, or fever as directed by your health care provider.   °· Follow a low-fat diet until seen again by your health care provider. Fat causes the gallbladder to contract, which can result in pain.   °· Follow up with your health care provider as directed. Attacks are almost always recurrent and surgery is usually required for permanent treatment.   °SEEK IMMEDIATE MEDICAL CARE IF:  °· Your pain increases and is not controlled by medicines.   °· You have a fever or persistent symptoms for more than 2-3 days.   °· You have a fever and your symptoms suddenly get worse.   °· You have persistent nausea and vomiting.   °MAKE SURE YOU:  °· Understand these instructions. °· Will watch your condition. °· Will get help right away if you are not doing well or get worse. °  °This information is not intended to replace advice given to you by your health care provider. Make sure you discuss any questions you have with your health care provider. °  °Document Released: 11/15/2005 Document Revised: 07/22/2013 Document Reviewed: 05/13/2013 °Elsevier Interactive Patient Education ©2016 Else[employer] Inc. °Gastritis, Adult °Gastritis is soreness and swelling (inflammation) of the lining of the stomach. Gastritis can develop as a sudden onset (acute) or long-term (chronic) condition. If gastritis is not treated, it can lead to stomach bleeding and ulcers. °CAUSES  °Gastritis occurs when the stomach lining is weak or damaged. Digestive juices from the stomach then inflame the weakened stomach lining. The stomach lining may be weak or damaged due to viral or bacterial infections. One common bacterial infection is the Helicobacter pylori infection. Gastritis can also result from excessive alcohol consumption, taking certain medicines, or having too much acid in the stomach.  °SYMPTOMS  °In some cases, there are no symptoms.  When symptoms are present, they may include: °· Pain or a burning sensation in the upper abdomen. °· Nausea. °· Vomiting. °· An uncomfortable feeling of fullness after eating. °DIAGNOSIS  °Your caregiver may suspect you have gastritis based on your symptoms and a physical exam. To determine the cause of your gastritis, your caregiver may perform the following: °· Blood or stool tests to check for the H pylori bacterium. °· Gastroscopy. A thin, flexible tube (endoscope) is passed down the esophagus and into the stomach. The endoscope has a light and camera on the end. Your caregiver uses the endoscope to view the inside of the stomach. °· Taking a tissue sample (biopsy) from the stomach to examine under a microscope. °TREATMENT  °Depending on the cause of your gastritis, medicines may be prescribed. If you have a bacterial infection, such as an H pylori infection, antibiotics may be given. If your gastritis is caused by too much acid in the stomach, H2 blockers or antacids may be given. Your caregiver may recommend that you stop taking aspirin, ibuprofen, or other nonsteroidal anti-inflammatory drugs (NSAIDs). °HOME CARE INSTRUCTIONS °· Only take over-the-counter or prescription medicines as directed by your caregiver. °· If you were given antibiotic medicines, take them as directed. Finish them even if you start to feel better. °·   better.  Drink enough fluids to keep your urine clear or pale yellow.  Avoid foods and drinks that make your symptoms worse, such as:  Caffeine or alcoholic drinks.  Chocolate.  Peppermint or mint flavorings.  Garlic and onions.  Spicy foods.  Citrus fruits, such as oranges, lemons, or limes.  Tomato-based foods such as sauce, chili, salsa, and pizza.  Fried and fatty foods.  Eat small, frequent meals instead of large meals. SEEK IMMEDIATE MEDICAL CARE IF:   You have black or dark red stools.  You vomit blood or material that looks like coffee grounds.  You are  unable to keep fluids down.  Your abdominal pain gets worse.  You have a fever.  You do not feel better after 1 week.  You have any other questions or concerns. MAKE SURE YOU:  Understand these instructions.  Will watch your condition.  Will get help right away if you are not doing well or get worse.   This information is not intended to replace advice given to you by your health care provider. Make sure you discuss any questions you have with your health care provider.   Document Released: 11/13/2001 Document Revised: 05/20/2012 Document Reviewed: 01/02/2012 Elsevier Interactive Patient Education [employer]2016 Else[employer] Inc.

## 2016-03-22 NOTE — ED Provider Notes (Signed)
CSN: [id number]     Arrival date & time 03/22/16  1411 History   First MD Initiated Contact with Patient 03/22/16 1542     Chief Complaint  Patient presents with  . Abdominal Pain      HPI  Patient presents for evaluation of epigastric abdominal pain. She describes symptoms since January, for the last 3 months. Epigastric. Initially only with food intake. Now has become rather constant and daily, but still exacerbated with food. Nausea was a regular symptom for her without vomiting. However, 2 days but started vomiting. His had one or 2 episodes for the last 3 days. Heme-negative nonbilious. A little loose stool today but no frank watery diarrhea or passage of blood. No fevers. Has not been jaundiced. No skin symptoms.  Family history positive for gallbladder disease in her mother. She does smoke. No alcohol, states that she had been trying aspirin and ibuprofen almost daily for the last few weeks. She stopped because it was "making it worse". In addition to Motrin she was taking Zantac or Prilosec once per day with no improvement. No recent antibiotic or steroid use. She drinks Dr. Reino Kent 2-3 per day. No additional caffeine intake.  Past Medical History  Diagnosis Date  . Drug abuse   . Depression   . Bipolar 2 disorder (HCC)   . Attention deficit hyperactivity disorder (ADHD)    Past Surgical History  Procedure Laterality Date  . Abdominal hysterectomy     Family History  Problem Relation Age of Onset  . Cancer Mother   . Heart failure Father    Social History  Substance Use Topics  . Smoking status: Current Every Day Smoker -- 0.50 packs/day for 25 years    Types: Cigarettes  . Smokeless tobacco: Never Used  . Alcohol Use: No   OB History    No data available     Review of Systems  Constitutional: Negative for fever, chills, diaphoresis, appetite change and fatigue.  HENT: Negative for mouth sores, sore throat and trouble swallowing.   Eyes: Negative for visual  disturbance.  Respiratory: Negative for cough, chest tightness, shortness of breath and wheezing.   Cardiovascular: Negative for chest pain.  Gastrointestinal: Positive for nausea, vomiting and abdominal pain. Negative for diarrhea and abdominal distention.  Endocrine: Negative for polydipsia, polyphagia and polyuria.  Genitourinary: Negative for dysuria, frequency and hematuria.  Musculoskeletal: Negative for gait problem.  Skin: Negative for color change, pallor and rash.  Neurological: Negative for dizziness, syncope, light-headedness and headaches.  Hematological: Does not bruise/bleed easily.  Psychiatric/Behavioral: Negative for behavioral problems and confusion.      Allergies  Compazine and Penicillins  Home Medications   Prior to Admission medications   Medication Sig Start Date End Date Taking? Authorizing Provider  HYDROcodone-acetaminophen (NORCO/VICODIN) 5-325 MG tablet Take 1 tablet by mouth every 4 (four) hours as needed. 03/22/16   Rolland Porter, MD  omeprazole (PRILOSEC) 20 MG capsule Take 1 capsule (20 mg total) by mouth 2 (two) times daily. 03/22/16   Rolland Porter, MD  ondansetron (ZOFRAN ODT) 4 MG disintegrating tablet Take 1 tablet (4 mg total) by mouth every 8 (eight) hours as needed for nausea. 03/22/16   Rolland Porter, MD  sucralfate (CARAFATE) 1 g tablet Take 1 tablet (1 g total) by mouth 4 (four) times daily. 03/22/16   Rolland Porter, MD   BP 125/74 mmHg  Pulse 80  Temp(Src) 98 F (36.7 C) (Oral)  Resp 18  Ht  (1.702 m)  Wt 184 lb (83.462 kg)  BMI 28.81 kg/m2  SpO2 99% Physical Exam  Constitutional: She is oriented to person, place, and time. She appears well-developed and well-nourished. No distress.  HENT:  Head: Normocephalic.  Eyes: Conjunctivae are normal. Pupils are equal, round, and reactive to light. No scleral icterus.  Neck: Normal range of motion. Neck supple. No thyromegaly present.  Cardiovascular: Normal rate and regular rhythm.  Exam reveals no  gallop and no friction rub.   No murmur heard. Pulmonary/Chest: Effort normal and breath sounds normal. No respiratory distress. She has no wheezes. She has no rales.  Abdominal: Soft. Bowel sounds are normal. She exhibits no distension. There is no tenderness. There is no rebound.    Musculoskeletal: Normal range of motion.  Neurological: She is alert and oriented to person, place, and time.  Skin: Skin is warm and dry. No rash noted.  Psychiatric: She has a normal mood and affect. Her behavior is normal.    ED Course  Procedures (including critical care time) Labs Review Labs Reviewed  LIPASE, BLOOD - Abnormal; Notable for the following:    Lipase 52 (*)    All other components within normal limits  COMPREHENSIVE METABOLIC PANEL - Abnormal; Notable for the following:    Total Protein 8.2 (*)    AST 165 (*)    ALT 201 (*)    All other components within normal limits  CBC - Abnormal; Notable for the following:    RBC 5.38 (*)    All other components within normal limits  URINALYSIS, ROUTINE W REFLEX MICROSCOPIC (NOT [facility]) - Abnormal; Notable for the following:    Glucose, UA 100 (*)    Hgb urine dipstick TRACE (*)    Ketones, ur 15 (*)    Protein, ur TRACE (*)    All other components within normal limits  URINE MICROSCOPIC-ADD ON - Abnormal; Notable for the following:    Squamous Epithelial / LPF 6-30 (*)    Bacteria, UA FEW (*)    All other components within normal limits    Imaging Review [place] Abdomen Limited Ruq  03/22/2016  CLINICAL DATA:  Vomiting and diarrhea beginning yesterday. EXAM: US ABDOMEN LIMITED - RIGHT UPPER QUADRANT COMPARISON:  None. FINDINGS: Gallbladder: A small amount of sludge or gravel type stones are present. No wall thickening or pericholecystic fluid. Sonographer reports negative Murphy's sign. Common bile duct: Diameter: 0.7 cm tapering to 0.5 cm Liver: No focal lesion is identified. Diffusely increased echogenicity is seen. IMPRESSION: Small  amount of gallbladder sludge with a few gravel type stones. Negative for cholecystitis. Fatty infiltration liver. Electronically Signed   By: Drusilla Kannerhomas  Dalessio M.D.   On: 03/22/2016 17:24   I have personally reviewed and evaluated these images and lab results as part of my medical decision-making.   EKG Interpretation None      MDM   Final diagnoses:  Epigastric pain  Gastritis  Calculus of gallbladder without cholecystitis without obstruction   Ultrasound with cholelithiasis, however no signs of obstruction or acute cholecystitis. Patient with mild elevation of transaminases and lipase. Symptoms improved with above measures. Plan will be proton pump inhibitor, Carafate, limited number Zofran, Vicodin. Avoid alcohol tobacco caffeine anti-inflammatories. Surgical referral regarding her stones. May need GI referral, and/or HIDA scan.    Rolland PorterMark Margaretta Chittum, MD 03/22/16 [phone]

## 2016-03-22 NOTE — ED Notes (Signed)
Patient reports of mid upper abdominal pain since January. Reports of eating exacerbates pain per patient. STates she started vomiting and diarrhea started yesterday morning.

## 2016-04-30 ENCOUNTER — Emergency Department (HOSPITAL_COMMUNITY)
Admission: EM | Admit: 2016-04-30 | Discharge: 2016-04-30 | Disposition: A | Discharge status: Home / Self Care | Payer: Self-pay | Attending: Emergency Medicine | Admitting: Emergency Medicine

## 2016-04-30 ENCOUNTER — Encounter (HOSPITAL_COMMUNITY): Payer: Self-pay | Admitting: Emergency Medicine

## 2016-04-30 ENCOUNTER — Emergency Department (HOSPITAL_COMMUNITY): Payer: Self-pay

## 2016-04-30 DIAGNOSIS — T798XXA Other early complications of trauma, initial encounter: Secondary | ICD-10-CM

## 2016-04-30 DIAGNOSIS — Y939 Activity, unspecified: Secondary | ICD-10-CM | POA: Insufficient documentation

## 2016-04-30 DIAGNOSIS — S93402A Sprain of unspecified ligament of left ankle, initial encounter: Secondary | ICD-10-CM | POA: Insufficient documentation

## 2016-04-30 DIAGNOSIS — F329 Major depressive disorder, single episode, unspecified: Secondary | ICD-10-CM | POA: Insufficient documentation

## 2016-04-30 DIAGNOSIS — T814XXA Infection following a procedure, initial encounter: Secondary | ICD-10-CM | POA: Insufficient documentation

## 2016-04-30 DIAGNOSIS — W458XXA Other foreign body or object entering through skin, initial encounter: Secondary | ICD-10-CM | POA: Insufficient documentation

## 2016-04-30 DIAGNOSIS — F909 Attention-deficit hyperactivity disorder, unspecified type: Secondary | ICD-10-CM | POA: Insufficient documentation

## 2016-04-30 DIAGNOSIS — F17210 Nicotine dependence, cigarettes, uncomplicated: Secondary | ICD-10-CM | POA: Insufficient documentation

## 2016-04-30 DIAGNOSIS — Z23 Encounter for immunization: Secondary | ICD-10-CM | POA: Insufficient documentation

## 2016-04-30 DIAGNOSIS — Y999 Unspecified external cause status: Secondary | ICD-10-CM | POA: Insufficient documentation

## 2016-04-30 DIAGNOSIS — Y829 Unspecified medical devices associated with adverse incidents: Secondary | ICD-10-CM | POA: Insufficient documentation

## 2016-04-30 DIAGNOSIS — Y929 Unspecified place or not applicable: Secondary | ICD-10-CM | POA: Insufficient documentation

## 2016-04-30 MED ORDER — HYDROCODONE-ACETAMINOPHEN 5-325 MG PO TABS: ORAL_TABLET | ORAL | Status: DC

## 2016-04-30 MED ORDER — TETANUS-DIPHTH-ACELL PERTUSSIS 5-2.5-18.5 LF-MCG/0.5 IM SUSP
0.5000 mL | Freq: Once | INTRAMUSCULAR | Status: AC
  Administered 2016-04-30: 0.5 mL via INTRAMUSCULAR
  Filled 2016-04-30: qty 0.5

## 2016-04-30 MED ORDER — NAPROXEN 500 MG PO TABS: 500.0000 mg | ORAL_TABLET | Freq: Two times a day (BID) | ORAL | Status: DC

## 2016-04-30 MED ORDER — SULFAMETHOXAZOLE-TRIMETHOPRIM 800-160 MG PO TABS: 1.0000 | ORAL_TABLET | Freq: Two times a day (BID) | ORAL | Status: AC

## 2016-04-30 MED ORDER — OXYCODONE-ACETAMINOPHEN 5-325 MG PO TABS
1.0000 | ORAL_TABLET | Freq: Once | ORAL | Status: AC
  Administered 2016-04-30: 1 via ORAL
  Filled 2016-04-30: qty 1

## 2016-04-30 NOTE — ED Notes (Signed)
Pt states a piece of wood fell on her left ankle Saturday night and cut it.  Abrasion noted to ankle with scabbing.

## 2016-04-30 NOTE — ED Notes (Signed)
Patient verbalizes understanding of discharge instructions, prescriptions, home care and follow up care. Patient out of department at this time. 

## 2016-04-30 NOTE — Discharge Instructions (Signed)
Ankle Sprain An ankle sprain is an injury to the strong, fibrous tissues (ligaments) that hold your ankle bones together.  HOME CARE   Put ice on your ankle for 1-2 days or as told by your doctor.  Put ice in a plastic bag.  Place a towel between your skin and the bag.  Leave the ice on for 15-20 minutes at a time, every 2 hours while you are awake.  Only take medicine as told by your doctor.  Raise (elevate) your injured ankle above the level of your heart as much as possible for 2-3 days.  Use crutches if your doctor tells you to. Slowly put your own weight on the affected ankle. Use the crutches until you can walk without pain.  If you have a plaster splint:  Do not rest it on anything harder than a pillow for 24 hours.  Do not put weight on it.  Do not get it wet.  Take it off to shower or bathe.  If given, use an elastic wrap or support stocking for support. Take the wrap off if your toes lose feeling (numb), tingle, or turn cold or blue.  If you have an air splint:  Add or let out air to make it comfortable.  Take it off at night and to shower and bathe.  Wiggle your toes and move your ankle up and down often while you are wearing it. GET HELP IF:  You have rapidly increasing bruising or puffiness (swelling).  Your toes feel very cold.  You lose feeling in your foot.  Your medicine does not help your pain. GET HELP RIGHT AWAY IF:   Your toes lose feeling (numb) or turn blue.  You have severe pain that is increasing. MAKE SURE YOU:   Understand these instructions.  Will watch your condition.  Will get help right away if you are not doing well or get worse.   This information is not intended to replace advice given to you by your health care provider. Make sure you discuss any questions you have with your health care provider.   Document Released: 05/07/2008 Document Revised: 12/10/2014 Document Reviewed: 06/02/2012 Elsevier Interactive Patient  Education 2016 [employer] Inc[employer]  Wound Infection A wound infection happens when a type of germ (bacteria) grows in a wound. Caring for the infection can help the wound heal. Wound infections need treatment. HOME CARE   Only take medicine as told by your doctor.  Take your antibiotic medicine as told. Finish it even if you start to feel better.  Clean the wound with mild soap and water as told. Rinse the soap off. Pat the area dry with a clean towel. Do not rub the wound.  Change any bandages (dressings) as told by your doctor.  Put cream and a bandage on the wound as told by your doctor.  If the bandage sticks, wet it with soapy water to remove the bandage.  Change the bandage if it gets wet, dirty, or starts to smell.  Take showers. Do not take baths, swim, or do anything that puts your wound under water.  Avoid exercise that makes you sweat.  If your wound itches, use a medicine that helps stop itching. Do not pick or scratch at the wound.  Keep all doctor visits as told. GET HELP RIGHT AWAY IF:   You have more puffiness (swelling), pain, or redness around the wound.  You have more yellowish-white fluid (pus) coming from the wound.  You have a bad  smell coming from the wound.  Your wound breaks open more.  You have a fever. MAKE SURE YOU:   Understand these instructions.  Will watch your condition.  Will get help right away if you are not doing well or get worse.   This information is not intended to replace advice given to you by your health care provider. Make sure you discuss any questions you have with your health care provider.   Document Released: 08/28/2008 Document Revised: 02/11/2012 Document Reviewed: 05/09/2015 Elsevier Interactive Patient Education [employer]
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
## 2016-08-03 ENCOUNTER — Emergency Department (HOSPITAL_COMMUNITY): Payer: Self-pay

## 2016-08-03 ENCOUNTER — Encounter (HOSPITAL_COMMUNITY): Payer: Self-pay

## 2016-08-03 ENCOUNTER — Emergency Department (HOSPITAL_COMMUNITY)
Admission: EM | Admit: 2016-08-03 | Discharge: 2016-08-03 | Disposition: A | Discharge status: Home / Self Care | Payer: Self-pay | Attending: Emergency Medicine | Admitting: Emergency Medicine

## 2016-08-03 DIAGNOSIS — F909 Attention-deficit hyperactivity disorder, unspecified type: Secondary | ICD-10-CM | POA: Insufficient documentation

## 2016-08-03 DIAGNOSIS — F17210 Nicotine dependence, cigarettes, uncomplicated: Secondary | ICD-10-CM | POA: Insufficient documentation

## 2016-08-03 DIAGNOSIS — K8020 Calculus of gallbladder without cholecystitis without obstruction: Secondary | ICD-10-CM | POA: Insufficient documentation

## 2016-08-03 LAB — URINE MICROSCOPIC-ADD ON

## 2016-08-03 LAB — COMPREHENSIVE METABOLIC PANEL
ALK PHOS: 98 U/L (ref 38–126)
ALT: 106 U/L — ABNORMAL HIGH (ref 14–54)
ANION GAP: 11 (ref 5–15)
AST: 80 U/L — ABNORMAL HIGH (ref 15–41)
Albumin: 4.6 g/dL (ref 3.5–5.0)
BUN: 12 mg/dL (ref 6–20)
CALCIUM: 9.5 mg/dL (ref 8.9–10.3)
CO2: 24 mmol/L (ref 22–32)
Chloride: 104 mmol/L (ref 101–111)
Creatinine, Ser: 0.88 mg/dL (ref 0.44–1.00)
GFR calc non Af Amer: >60 mL/min (ref >60)
Glucose, Bld: 78 mg/dL (ref 65–99)
Potassium: 3.7 mmol/L (ref 3.5–5.1)
SODIUM: 139 mmol/L (ref 135–145)
TOTAL PROTEIN: 8.2 g/dL — AB (ref 6.5–8.1)
Total Bilirubin: 0.5 mg/dL (ref 0.3–1.2)

## 2016-08-03 LAB — URINALYSIS, ROUTINE W REFLEX MICROSCOPIC
Bilirubin Urine: NEGATIVE
Glucose, UA: NEGATIVE mg/dL
Ketones, ur: NEGATIVE mg/dL
NITRITE: NEGATIVE
Protein, ur: NEGATIVE mg/dL
pH: 6 (ref 5.0–8.0)

## 2016-08-03 LAB — CBC
HCT: 41.5 % (ref 36.0–46.0)
HEMOGLOBIN: 13.5 g/dL (ref 12.0–15.0)
MCH: 27.3 pg (ref 26.0–34.0)
MCHC: 32.5 g/dL (ref 30.0–36.0)
MCV: 84 fL (ref 78.0–100.0)
Platelets: 192 10*3/uL (ref 150–400)
RBC: 4.94 MIL/uL (ref 3.87–5.11)
RDW: 14.1 % (ref 11.5–15.5)
WBC: 8.6 10*3/uL (ref 4.0–10.5)

## 2016-08-03 LAB — LIPASE, BLOOD: LIPASE: 73 U/L — AB (ref 11–51)

## 2016-08-03 MED ORDER — IOPAMIDOL (ISOVUE-300) INJECTION 61%
100.0000 mL | Freq: Once | INTRAVENOUS | Status: AC | PRN
  Administered 2016-08-03: 100 mL via INTRAVENOUS

## 2016-08-03 MED ORDER — SODIUM CHLORIDE 0.9 % IV SOLN
1000.0000 mL | Freq: Once | INTRAVENOUS | Status: AC
  Administered 2016-08-03: 1000 mL via INTRAVENOUS

## 2016-08-03 MED ORDER — HYDROMORPHONE HCL 1 MG/ML IJ SOLN
1.0000 mg | Freq: Once | INTRAMUSCULAR | Status: AC
  Administered 2016-08-03: 1 mg via INTRAVENOUS
  Filled 2016-08-03: qty 1

## 2016-08-03 MED ORDER — SODIUM CHLORIDE 0.9 % IV SOLN
1000.0000 mL | INTRAVENOUS | Status: DC
  Administered 2016-08-03: 1000 mL via INTRAVENOUS

## 2016-08-03 MED ORDER — ONDANSETRON HCL 4 MG/2ML IJ SOLN
4.0000 mg | Freq: Once | INTRAMUSCULAR | Status: AC
  Administered 2016-08-03: 4 mg via INTRAVENOUS
  Filled 2016-08-03: qty 2

## 2016-08-03 MED ORDER — PROMETHAZINE HCL 25 MG RE SUPP: 25.0000 mg | Freq: Four times a day (QID) | RECTAL | 0 refills | Status: DC | PRN

## 2016-08-03 MED ORDER — SUCRALFATE 1 GM/10ML PO SUSP: 1.0000 g | Freq: Three times a day (TID) | ORAL | 0 refills | Status: DC

## 2016-08-03 NOTE — ED Triage Notes (Signed)
Reports of upper abdominal pain x3 days. States she feels bloated. Reports of nausea/vomiting/diarrhea. States she has current problems with her gallbladder. Eating exacerbates pain.

## 2016-08-03 NOTE — ED Provider Notes (Addendum)
[facility]EM[facility] DEPT Provider Note   CSN: [id number] Arrival date & time: 08/03/16  1927     History   Chief Complaint Chief Complaint  Patient presents with  . Abdominal Pain    HPI [name] is a 50 y.o. female.  HPI Patient presents emergency department with worsening intermittent waxing and waning upper abdominal pain this week with increasing pain in the right upper quadrant.  She has a known history of cholelithiasis.  She denies fevers and chills.  No hematemesis.  Her pain is moderate to severe in severity at this time.  No chest pain shortness of breath.  No cough.  No dysuria or urinary frequency.  No back pain.   Past Medical History:  Diagnosis Date  . Attention deficit hyperactivity disorder (ADHD)   . Bipolar 2 disorder (HCC)   . Depression   . Drug abuse     Patient Active Problem List   Diagnosis Date Noted  . ADHD (attention deficit hyperactivity disorder) 05/16/2012  . 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 Major depressive disorder, recurrent episode (HCC) 05/15/2012  . Generalized anxiety disorder 05/15/2012    Past Surgical History:  Procedure Laterality Date  . ABDOMINAL HYSTERECTOMY      OB History    No data available       Home Medications    Prior to Admission medications   Medication Sig Start Date End Date Taking? Authorizing Provider  omeprazole (PRILOSEC) 20 MG capsule Take 1 capsule (20 mg total) by mouth 2 (two) times daily. Patient not taking: Reported on 08/03/2016 03/22/16   Rolland PorterMark James, MD  sucralfate (CARAFATE) 1 g tablet Take 1 tablet (1 g total) by mouth 4 (four) times daily. Patient not taking: Reported on 08/03/2016 03/22/16   Rolland PorterMark James, MD    Family History Family History  Problem Relation Age of Onset  . Cancer Mother   . Heart failure Father     Social History Social History  Substance Use Topics  . Smoking status: Current Every Day Smoker    Packs/day: 0.50    Years: 25.00    Types: Cigarettes    . Smokeless tobacco: Never Used  . Alcohol use No     Allergies   Compazine and Penicillins   Review of Systems Review of Systems  All other systems reviewed and are negative.    Physical Exam Updated Vital Signs BP 106/71 (BP Location: Right Arm)   Pulse 83   Temp 98.2 F (36.8 C) (Oral)   Resp 18   Ht 5\' 7"  (1.702 m)   Wt 190 lb (86.2 kg)   SpO2 99%   BMI 29.76 kg/m   Physical Exam  Constitutional: She is oriented to person, place, and time. She appears well-developed and well-nourished. No distress.  HENT:  Head: Normocephalic and atraumatic.  Eyes: EOM are normal.  Neck: Normal range of motion.  Cardiovascular: Normal rate, regular rhythm and normal heart sounds.   Pulmonary/Chest: Effort normal and breath sounds normal.  Abdominal: Soft. She exhibits no distension.  Mild tenderness right upper quadrant  Musculoskeletal: Normal range of motion.  Neurological: She is alert and oriented to person, place, and time.  Skin: Skin is warm and dry.  Psychiatric: She has a normal mood and affect. Judgment normal.  Nursing note and vitals reviewed.    ED Treatments / Results  Labs (all labs ordered are listed, but only abnormal results are displayed) Labs Reviewed  LIPASE, BLOOD - Abnormal; Notable for  the following:       Result Value   Lipase 73 (*)    All other components within normal limits  COMPREHENSIVE METABOLIC PANEL - Abnormal; Notable for the following:    Total Protein 8.2 (*)    AST 80 (*)    ALT 106 (*)    All other components within normal limits  URINALYSIS, ROUTINE W REFLEX MICROSCOPIC (NOT AT [facility]) - Abnormal; Notable for the following:    Specific Gravity, Urine <1.005 (*)    Hgb urine dipstick SMALL (*)    Leukocytes, UA SMALL (*)    All other components within normal limits  URINE MICROSCOPIC-ADD ON - Abnormal; Notable for the following:    Squamous Epithelial / LPF 0-5 (*)    Bacteria, UA RARE (*)    All other components within  normal limits  CBC   ALT  Date Value Ref Range Status  08/03/2016 106 (H) 14 - 54 U/L Final  03/22/2016 201 (H) 14 - 54 U/L Final  04/30/2013 115 (H) 0 - 35 U/L Final  05/23/2012 24 0 - 35 U/L Final    AST  Date Value Ref Range Status  08/03/2016 80 (H) 15 - 41 U/L Final  03/22/2016 165 (H) 15 - 41 U/L Final  04/30/2013 60 (H) 0 - 37 U/L Final  05/23/2012 25 0 - 37 U/L Final     EKG  EKG Interpretation None       Radiology Ct Abdomen Pelvis W Contrast  Result Date: 08/03/2016 CLINICAL DATA:  Upper abdominal pain for 3 days. Bloating. Nausea, vomiting, and diarrhea. Pain exacerbated by eating. EXAM: CT ABDOMEN AND PELVIS WITH CONTRAST TECHNIQUE: Multidetector CT imaging of the abdomen and pelvis was performed using the standard protocol following bolus administration of intravenous contrast. CONTRAST:  ISOVUE-300 IOPAMIDOL (ISOVUE-300) INJECTION 61% COMPARISON:  10/22/2012 FINDINGS: Small air cyst in the left lung base. Tiny nodules demonstrated in the lung bases anteriorly, largest measuring about 2 mm diameter. No change since previous study. Small esophageal hiatal hernia. Mild diffuse fatty infiltration of the liver. Cholelithiasis without gallbladder wall thickening or infiltration. No bile duct dilatation. The pancreas, spleen, adrenal glands, kidneys, abdominal aorta, inferior vena cava, and retroperitoneal lymph nodes are unremarkable. Stomach, small bowel, and colon are not abnormally distended. No free air or free fluid in the abdomen. Pelvis: Appendix is normal. Bladder wall is not thickened. Uterus is surgically absent. No pelvic mass or lymphadenopathy. No free or loculated pelvic fluid collections. Degenerative changes in the lumbar spine. No destructive bone lesions. IMPRESSION: Cholelithiasis without inflammatory changes around the gallbladder. Mild fatty infiltration of the liver. Small esophageal hiatal hernia. No evidence of bowel obstruction or inflammation.  Electronically Signed   By: Burman Nieves M.D.   On: 08/03/2016 22:18    Procedures Procedures (including critical care time)  Medications Ordered in ED Medications  0.9 %  sodium chloride infusion (0 mLs Intravenous Stopped 08/03/16 2129)    Followed by  0.9 %  sodium chloride infusion (1,000 mLs Intravenous Gloria Bag/Given 08/03/16 2129)  HYDROmorphone (DILAUDID) injection 1 mg (1 mg Intravenous Given 08/03/16 2023)  ondansetron (ZOFRAN) injection 4 mg (4 mg Intravenous Given 08/03/16 2023)  iopamidol (ISOVUE-300) 61 % injection 100 mL (100 mLs Intravenous Contrast Given 08/03/16 2213)     Initial Impression / Assessment and Plan / ED Course  I have reviewed the triage vital signs and the nursing notes.  Pertinent labs & imaging results that were available during my  care of the patient were reviewed by me and considered in my medical decision making (see chart for details).  Clinical Course    Patient with known history of cholelithiasis.  She has a mildly elevated lipase and LFTs although this appears consistent with her baseline levels.  10:32 PM Patient feels much better at this time.  She would like to be discharged home.  She states that the Carafate helped a lot last time.  Discharge home with Phenergan and Carafate.  She understands return to the ER for Gloria or worsening symptoms.  She is struggling to follow-up as an outpatient for outpatient cholecystectomy given insurance issues.  She is currently working on this.  Final Clinical Impressions(s) / ED Diagnoses   Final diagnoses:  None    Gloria Prescriptions Gloria Prescriptions   No medications on file     Azalia Bilis, MD 08/03/16 1610    Azalia Bilis, MD 08/03/16 2234

## 2016-08-03 NOTE — ED Notes (Signed)
Pt to CT at this time.

## 2016-10-03 ENCOUNTER — Emergency Department (HOSPITAL_COMMUNITY): Payer: Self-pay

## 2016-10-03 ENCOUNTER — Emergency Department (HOSPITAL_COMMUNITY)
Admission: EM | Admit: 2016-10-03 | Discharge: 2016-10-03 | Disposition: A | Discharge status: Home / Self Care | Payer: Self-pay | Attending: Emergency Medicine | Admitting: Emergency Medicine

## 2016-10-03 ENCOUNTER — Encounter (HOSPITAL_COMMUNITY): Payer: Self-pay

## 2016-10-03 DIAGNOSIS — Y9301 Activity, walking, marching and hiking: Secondary | ICD-10-CM | POA: Insufficient documentation

## 2016-10-03 DIAGNOSIS — S63501A Unspecified sprain of right wrist, initial encounter: Secondary | ICD-10-CM | POA: Insufficient documentation

## 2016-10-03 DIAGNOSIS — Z79899 Other long term (current) drug therapy: Secondary | ICD-10-CM | POA: Insufficient documentation

## 2016-10-03 DIAGNOSIS — W1839XA Other fall on same level, initial encounter: Secondary | ICD-10-CM | POA: Insufficient documentation

## 2016-10-03 DIAGNOSIS — F17210 Nicotine dependence, cigarettes, uncomplicated: Secondary | ICD-10-CM | POA: Insufficient documentation

## 2016-10-03 DIAGNOSIS — F909 Attention-deficit hyperactivity disorder, unspecified type: Secondary | ICD-10-CM | POA: Insufficient documentation

## 2016-10-03 DIAGNOSIS — Y999 Unspecified external cause status: Secondary | ICD-10-CM | POA: Insufficient documentation

## 2016-10-03 DIAGNOSIS — Y929 Unspecified place or not applicable: Secondary | ICD-10-CM | POA: Insufficient documentation

## 2016-10-03 MED ORDER — HYDROCODONE-ACETAMINOPHEN 5-325 MG PO TABS
1.0000 | ORAL_TABLET | Freq: Once | ORAL | Status: AC
  Administered 2016-10-03: 1 via ORAL
  Filled 2016-10-03: qty 1

## 2016-10-03 MED ORDER — HYDROCODONE-ACETAMINOPHEN 5-325 MG PO TABS: 1.0000 | ORAL_TABLET | Freq: Four times a day (QID) | ORAL | 0 refills | Status: DC | PRN

## 2016-10-03 NOTE — Discharge Instructions (Signed)
You may take the pain medicine as needed. Continue to rest, ice, elevate your right wrist. Please check your capillary refill as discussed for signs of compartment syndrome as discussed. You may loosen your Ace wrap as needed if it is too tight. Please keep the splint on until follow-up with orthopedics. I have given you a referral to the orthopedist.

## 2016-10-03 NOTE — ED Provider Notes (Signed)
[facility] Provider Note   CSN: [id number] Arrival date & time: 10/03/16  1615     History   Chief Complaint Chief Complaint  Patient presents with  . Fall  . Wrist Pain    HPI [name] is a 50 y.o. female.  50 year old Caucasian female with no significant past medical history presents to the ED today with right wrist pain after fall prior to arrival. Patient states that she was playing with her dog outside when she fell onto her right wrist and a stump. The pain was acute in onset. Has gradually worsened. She has not tried anything at home for the pain. Moving makes the pain worse. Nothing makes pain better. Patient endorses mild numbness to right hand but is able to move it. Denies any other symptoms.      Past Medical History:  Diagnosis Date  . Attention deficit hyperactivity disorder (ADHD)   . Bipolar 2 disorder (HCC)   . Depression   . Drug abuse     Patient Active Problem List   Diagnosis Date Noted  . ADHD (attention deficit hyperactivity disorder) 05/16/2012  . 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 Major depressive disorder, recurrent episode (HCC) 05/15/2012  . Generalized anxiety disorder 05/15/2012    Past Surgical History:  Procedure Laterality Date  . ABDOMINAL HYSTERECTOMY      OB History    No data available       Home Medications    Prior to Admission medications   Medication Sig Start Date End Date Taking? Authorizing Provider  omeprazole (PRILOSEC) 20 MG capsule Take 1 capsule (20 mg total) by mouth 2 (two) times daily. Patient not taking: Reported on 08/03/2016 03/22/16   Rolland PorterMark James, MD  promethazine (PHENERGAN) 25 MG suppository Place 1 suppository (25 mg total) rectally every 6 (six) hours as needed for nausea or vomiting. 08/03/16   Azalia BilisKevin Campos, MD  sucralfate (CARAFATE) 1 GM/10ML suspension Take 10 mLs (1 g total) by mouth 4 (four) times daily -  with meals and at bedtime. 08/03/16   Azalia BilisKevin Campos, MD    Family  History Family History  Problem Relation Age of Onset  . Cancer Mother   . Heart failure Father     Social History Social History  Substance Use Topics  . Smoking status: Current Every Day Smoker    Packs/day: 0.50    Years: 25.00    Types: Cigarettes  . Smokeless tobacco: Never Used  . Alcohol use No     Allergies   Compazine and Penicillins   Review of Systems Review of Systems  Constitutional: Negative for chills and fever.  HENT: Negative for congestion and rhinorrhea.   Eyes: Negative for visual disturbance.  Respiratory: Negative for cough.   Musculoskeletal: Positive for joint swelling.  Skin: Positive for color change.  All other systems reviewed and are negative.    Physical Exam Updated Vital Signs BP 136/86 (BP Location: Left Arm)   Pulse 86   Temp 98.1 F (36.7 C) (Oral)   Resp 18   Ht 5\' 7"  (1.702 m)   Wt 86.2 kg   SpO2 99%   BMI 29.76 kg/m   Physical Exam  Constitutional: She appears well-developed and well-nourished. No distress.  Eyes: Right eye exhibits no discharge. Left eye exhibits no discharge. No scleral icterus.  Pulmonary/Chest: No respiratory distress.  Musculoskeletal:       Right elbow: Normal.She exhibits normal range of motion, no swelling, no effusion and no  deformity. No tenderness found.       Right wrist: She exhibits decreased range of motion (The patient able to flex but is unable to fully extend wrist.), tenderness (Tenderness is noted to the distal ulnar region.), bony tenderness, swelling and deformity. She exhibits no crepitus and no laceration (No open wounds. Abrasions and ecchymosis noted to ulnar region.).  Radial pulses are 2+ bilaterally. Sensation intact. Cap refill normal. Patient with full range motion of phalanges. A patient is able to fully flex her right wrist. Unable to fully extend the right wrist. She does edema over the distal ulna. She also exhibits schapoid tenderness.  Neurological: She is alert.  Skin:  No pallor.  Nursing note and vitals reviewed.    ED Treatments / Results  Labs (all labs ordered are listed, but only abnormal results are displayed) Labs Reviewed - No data to display  EKG  EKG Interpretation None       Radiology Dg Forearm Right  Result Date: 10/03/2016 CLINICAL DATA:  Ulnar side wrist pain, midshaft forearm pain, tripping injury, initial encounter. EXAM: RIGHT FOREARM - 2 VIEW COMPARISON:  None. FINDINGS: No acute osseous abnormality. There is distal forearm soft tissue swelling. IMPRESSION: Distal forearm soft tissue swelling without fracture. Electronically Signed   By: Leanna BattlesMelinda  Blietz M.D.   On: 10/03/2016 18:20   Dg Wrist Complete Right  Result Date: 10/03/2016 CLINICAL DATA:  Fall wall running. Right wrist pain, bruising, and abrasions. Initial encounter. EXAM: RIGHT WRIST - COMPLETE 3+ VIEW COMPARISON:  None. FINDINGS: There is no evidence of fracture or dislocation. There is no evidence of arthropathy or other focal bone abnormality. Soft tissue swelling is seen along the ulnar aspect of the distal forearm and wrist. IMPRESSION: Ulnar sided soft tissue swelling. No evidence of fracture or dislocation . Electronically Signed   By: Myles RosenthalJohn  Stahl M.D.   On: 10/03/2016 17:26    Procedures Procedures (including critical care time)  Medications Ordered in ED Medications - No data to display   Initial Impression / Assessment and Plan / ED Course  I have reviewed the triage vital signs and the nursing notes.  Pertinent labs & imaging results that were available during my care of the patient were reviewed by me and considered in my medical decision making (see chart for details).  Clinical Course  Patient X-Ray negative for obvious fracture or dislocation. Pain managed in ED. Pt advised to follow up with orthopedics if symptoms persist for possibility of missed fracture diagnosis. No signs of compartment syndrome. Patient placed in thumb spica splint given  scaphoid tenderness even though no obvious fracture seen on xray, conservative therapy recommended and discussed. Patient will be dc home & is agreeable with above plan. Strict return precautions discussed including education on compartment syndrome.   Final Clinical Impressions(s) / ED Diagnoses   Final diagnoses:  Sprain of right wrist, initial encounter    Gloria Prescriptions Discharge Medication List as of 10/03/2016  7:04 PM    START taking these medications   Details  HYDROcodone-acetaminophen (NORCO) 5-325 MG tablet Take 1-2 tablets by mouth every 6 (six) hours as needed for moderate pain., Starting Wed 10/03/2016, Print         Rise MuKenneth T Franklin [place], PA-C 10/04/16 1732    Jacalyn LefevreJulie Haviland, MD 10/04/16 2142

## 2016-10-03 NOTE — ED Triage Notes (Signed)
Reports of falling while walking dog today. Complains of right wrist pain. Swelling noted.

## 2016-11-06 ENCOUNTER — Encounter (HOSPITAL_COMMUNITY): Payer: Self-pay

## 2016-11-06 ENCOUNTER — Emergency Department (HOSPITAL_COMMUNITY)
Admission: EM | Admit: 2016-11-06 | Discharge: 2016-11-06 | Disposition: A | Discharge status: Home / Self Care | Payer: Self-pay | Attending: Emergency Medicine | Admitting: Emergency Medicine

## 2016-11-06 DIAGNOSIS — F17210 Nicotine dependence, cigarettes, uncomplicated: Secondary | ICD-10-CM | POA: Insufficient documentation

## 2016-11-06 DIAGNOSIS — Z79899 Other long term (current) drug therapy: Secondary | ICD-10-CM | POA: Insufficient documentation

## 2016-11-06 DIAGNOSIS — F909 Attention-deficit hyperactivity disorder, unspecified type: Secondary | ICD-10-CM | POA: Insufficient documentation

## 2016-11-06 DIAGNOSIS — X088XXA Exposure to other specified smoke, fire and flames, initial encounter: Secondary | ICD-10-CM | POA: Insufficient documentation

## 2016-11-06 DIAGNOSIS — Y929 Unspecified place or not applicable: Secondary | ICD-10-CM | POA: Insufficient documentation

## 2016-11-06 DIAGNOSIS — Y9389 Activity, other specified: Secondary | ICD-10-CM | POA: Insufficient documentation

## 2016-11-06 DIAGNOSIS — T24001A Burn of unspecified degree of unspecified site of right lower limb, except ankle and foot, initial encounter: Secondary | ICD-10-CM

## 2016-11-06 DIAGNOSIS — Y999 Unspecified external cause status: Secondary | ICD-10-CM | POA: Insufficient documentation

## 2016-11-06 DIAGNOSIS — T24231A Burn of second degree of right lower leg, initial encounter: Secondary | ICD-10-CM | POA: Insufficient documentation

## 2016-11-06 MED ORDER — SILVER SULFADIAZINE 1 % EX CREA: 1.0000 "application " | TOPICAL_CREAM | Freq: Every day | CUTANEOUS | 0 refills | Status: DC

## 2016-11-06 MED ORDER — HYDROMORPHONE HCL 1 MG/ML IJ SOLN
0.5000 mg | Freq: Once | INTRAMUSCULAR | Status: AC
  Administered 2016-11-06: 0.5 mg via INTRAVENOUS
  Filled 2016-11-06: qty 1

## 2016-11-06 MED ORDER — OXYCODONE-ACETAMINOPHEN 5-325 MG PO TABS: 1.0000 | ORAL_TABLET | Freq: Four times a day (QID) | ORAL | 0 refills | Status: DC | PRN

## 2016-11-06 MED ORDER — SILVER SULFADIAZINE 1 % EX CREA
TOPICAL_CREAM | CUTANEOUS | Status: AC
  Filled 2016-11-06: qty 50

## 2016-11-06 MED ORDER — SILVER SULFADIAZINE 1 % EX CREA
TOPICAL_CREAM | Freq: Every day | CUTANEOUS | Status: DC
  Administered 2016-11-06: 10:00:00 via TOPICAL

## 2016-11-06 MED ORDER — OXYCODONE-ACETAMINOPHEN 5-325 MG PO TABS
1.0000 | ORAL_TABLET | Freq: Once | ORAL | Status: AC
  Administered 2016-11-06: 1 via ORAL
  Filled 2016-11-06: qty 1

## 2016-11-06 MED ORDER — SILVER SULFADIAZINE 1 % EX CREA
TOPICAL_CREAM | Freq: Once | CUTANEOUS | Status: AC
  Administered 2016-11-06: 08:00:00 via TOPICAL
  Filled 2016-11-06: qty 50

## 2016-11-06 NOTE — ED Notes (Signed)
Silvadene cream applied to right foot and leg and wrapped

## 2016-11-06 NOTE — ED Notes (Addendum)
Pt ambulated to BR. States pain not relieved. Dr Estell HarpinZammit notified. Ordered Dilaudid 0.5 mg IV.
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
## 2016-11-06 NOTE — Discharge Instructions (Signed)
Follow-up with her family doctor for recheck in 2-3 days. Apply Silvadene once a day

## 2016-11-06 NOTE — ED Notes (Addendum)
Per Dr zammit give pt extra container of silvadene to take home

## 2017-01-01 ENCOUNTER — Emergency Department (HOSPITAL_COMMUNITY): Payer: Self-pay

## 2017-01-01 ENCOUNTER — Emergency Department (HOSPITAL_COMMUNITY)
Admission: EM | Admit: 2017-01-01 | Discharge: 2017-01-01 | Disposition: A | Discharge status: Home / Self Care | Payer: Self-pay | Attending: Emergency Medicine | Admitting: Emergency Medicine

## 2017-01-01 ENCOUNTER — Encounter (HOSPITAL_COMMUNITY): Payer: Self-pay | Admitting: Emergency Medicine

## 2017-01-01 DIAGNOSIS — F909 Attention-deficit hyperactivity disorder, unspecified type: Secondary | ICD-10-CM | POA: Insufficient documentation

## 2017-01-01 DIAGNOSIS — Y999 Unspecified external cause status: Secondary | ICD-10-CM | POA: Insufficient documentation

## 2017-01-01 DIAGNOSIS — S2242XA Multiple fractures of ribs, left side, initial encounter for closed fracture: Secondary | ICD-10-CM | POA: Insufficient documentation

## 2017-01-01 DIAGNOSIS — Y939 Activity, unspecified: Secondary | ICD-10-CM | POA: Insufficient documentation

## 2017-01-01 DIAGNOSIS — X501XXA Overexertion from prolonged static or awkward postures, initial encounter: Secondary | ICD-10-CM | POA: Insufficient documentation

## 2017-01-01 DIAGNOSIS — F17210 Nicotine dependence, cigarettes, uncomplicated: Secondary | ICD-10-CM | POA: Insufficient documentation

## 2017-01-01 DIAGNOSIS — Y929 Unspecified place or not applicable: Secondary | ICD-10-CM | POA: Insufficient documentation

## 2017-01-01 MED ORDER — HYDROCODONE-ACETAMINOPHEN 5-325 MG PO TABS
1.0000 | ORAL_TABLET | Freq: Once | ORAL | Status: AC
  Administered 2017-01-01: 1 via ORAL
  Filled 2017-01-01: qty 1

## 2017-01-01 MED ORDER — IBUPROFEN 600 MG PO TABS: 600.0000 mg | ORAL_TABLET | Freq: Four times a day (QID) | ORAL | 0 refills | Status: DC | PRN

## 2017-01-01 MED ORDER — HYDROCODONE-ACETAMINOPHEN 5-325 MG PO TABS: 1.0000 | ORAL_TABLET | ORAL | 0 refills | Status: DC | PRN

## 2017-01-01 MED ORDER — KETOROLAC TROMETHAMINE 60 MG/2ML IM SOLN
60.0000 mg | Freq: Once | INTRAMUSCULAR | Status: AC
  Administered 2017-01-01: 60 mg via INTRAMUSCULAR
  Filled 2017-01-01: qty 2

## 2017-01-01 NOTE — Discharge Instructions (Signed)
You may take the hydrocodone prescribed for pain relief.  This will make you drowsy - do not drive within 4 hours of taking this medication. ° °

## 2017-01-01 NOTE — ED Triage Notes (Signed)
Patient complaining of left sided rib pain since yesterday. States "I leaned over the recliner yesterday and felt a pop and it's been hurting ever since."

## 2017-01-01 NOTE — ED Notes (Signed)
Pt ambulatory to waiting room. Pt verbalized understanding of discharge instructions.   

## 2017-01-01 NOTE — ED Provider Notes (Signed)
[facility] Provider Note   CSN: [id number] Arrival date & time: 01/01/17  1842     History   Chief Complaint Chief Complaint  Patient presents with  . Rib Injury    HPI [name] is a 51 y.o. female presenting with left sided rib pain which started suddenly yesterday morning when she bent over the arm of a chair.  She stayed here overnight with a hospitalized family member, sleeping in a visitors chair.  She felt a sudden popping sound and severe pain since the event worsened with movement,  Palpation and deep inspiration.  She denies shortness of breath.  She denies prior history of chest injuries.  She has had no treatments prior to arrival for this problem.  The history is provided by the patient.    Past Medical History:  Diagnosis Date  . Attention deficit hyperactivity disorder (ADHD)   . Bipolar 2 disorder (HCC)   . Depression   . Drug abuse     Patient Active Problem List   Diagnosis Date Noted  . ADHD (attention deficit hyperactivity disorder) 05/16/2012  . 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 Major depressive disorder, recurrent episode (HCC) 05/15/2012  . Generalized anxiety disorder 05/15/2012    Past Surgical History:  Procedure Laterality Date  . ABDOMINAL HYSTERECTOMY      OB History    No data available       Home Medications    Prior to Admission medications   Medication Sig Start Date End Date Taking? Authorizing Provider  HYDROcodone-acetaminophen (NORCO/VICODIN) 5-325 MG tablet Take 1 tablet by mouth every 4 (four) hours as needed. 01/01/17   Burgess Amor, PA-C  HYDROcodone-acetaminophen (NORCO/VICODIN) 5-325 MG tablet Take 1 tablet by mouth every 4 (four) hours as needed. 01/01/17   Burgess Amor, PA-C  ibuprofen (ADVIL,MOTRIN) 600 MG tablet Take 1 tablet (600 mg total) by mouth every 6 (six) hours as needed. 01/01/17   Burgess Amor, PA-C  omeprazole (PRILOSEC) 20 MG capsule Take 1 capsule (20 mg total) by mouth 2 (two) times  daily. Patient not taking: Reported on 08/03/2016 03/22/16   Rolland Porter, MD  oxyCODONE-acetaminophen (PERCOCET/ROXICET) 5-325 MG tablet Take 1 tablet by mouth every 6 (six) hours as needed. 11/06/16   Bethann Berkshire, MD  promethazine (PHENERGAN) 25 MG suppository Place 1 suppository (25 mg total) rectally every 6 (six) hours as needed for nausea or vomiting. 08/03/16   Azalia Bilis, MD  silver sulfADIAZINE (SILVADENE) 1 % cream Apply 1 application topically daily. 11/06/16   Bethann Berkshire, MD  silver sulfADIAZINE (SILVADENE) 1 % cream Apply 1 application topically daily. 11/06/16   Bethann Berkshire, MD  sucralfate (CARAFATE) 1 GM/10ML suspension Take 10 mLs (1 g total) by mouth 4 (four) times daily -  with meals and at bedtime. 08/03/16   Azalia Bilis, MD    Family History Family History  Problem Relation Age of Onset  . Cancer Mother   . Heart failure Father     Social History Social History  Substance Use Topics  . Smoking status: Current Every Day Smoker    Packs/day: 0.50    Years: 25.00    Types: Cigarettes  . Smokeless tobacco: Never Used  . Alcohol use No     Allergies   Compazine and Penicillins   Review of Systems Review of Systems  Constitutional: Negative for chills and fever.  HENT: Negative for congestion and sore throat.   Eyes: Negative.   Respiratory: Negative for cough, chest  tightness and shortness of breath.   Cardiovascular: Positive for chest pain.  Gastrointestinal: Negative for nausea and vomiting.  Genitourinary: Negative.   Musculoskeletal: Negative.   Skin: Negative.  Negative for color change, rash and wound.  Neurological: Negative for dizziness, weakness, light-headedness, numbness and headaches.  Psychiatric/Behavioral: Negative.      Physical Exam Updated Vital Signs BP 137/88 (BP Location: Right Arm)   Pulse 89   Temp 97.4 F (36.3 C) (Oral)   Resp 20   Ht 5\' 7"  (1.702 m)   Wt 83.9 kg   SpO2 100%   BMI 28.98 kg/m   Physical Exam    Constitutional: She appears well-developed and well-nourished.  HENT:  Head: Normocephalic and atraumatic.  Eyes: Conjunctivae are normal.  Neck: Normal range of motion.  Cardiovascular: Normal rate, regular rhythm, normal heart sounds and intact distal pulses.   Pulmonary/Chest: Breath sounds normal. She has no wheezes. She exhibits tenderness and bony tenderness. She exhibits no crepitus, no edema and no swelling.    Reduced effort secondary to pain.  Abdominal: Soft. Bowel sounds are normal. There is no tenderness.  Musculoskeletal: Normal range of motion.  Neurological: She is alert.  Skin: Skin is warm and dry.  Psychiatric: She has a normal mood and affect.  Nursing note and vitals reviewed.    ED Treatments / Results  Labs (all labs ordered are listed, but only abnormal results are displayed) Labs Reviewed - No data to display  EKG  EKG Interpretation None       Radiology Dg Ribs Unilateral W/chest Left  Result Date: 01/01/2017 CLINICAL DATA:  Left lower anterior rib pain since yesterday, when she felt a pop while leaning over the arm rest of a chair. EXAM: LEFT RIBS AND CHEST - 3+ VIEW COMPARISON:  02/21/2012 FINDINGS: There are acute minimally displaced fractures of the left eighth and ninth ribs anteriorly. No pneumothorax. No effusion. Minor curvilinear atelectasis or scarring in the left base. The lungs are otherwise clear. Hilar, mediastinal and cardiac contours are unremarkable and unchanged. IMPRESSION: Acute nondisplaced or minimally displaced left eighth and ninth rib fractures anteriorly. Electronically Signed   By: Ellery Plunk M.D.   On: 01/01/2017 19:33    Procedures Procedures (including critical care time)  Medications Ordered in ED Medications  ketorolac (TORADOL) injection 60 mg (60 mg Intramuscular Given 01/01/17 2041)  HYDROcodone-acetaminophen (NORCO/VICODIN) 5-325 MG per tablet 1 tablet (1 tablet Oral Given 01/01/17 2041)     Initial  Impression / Assessment and Plan / ED Course  I have reviewed the triage vital signs and the nursing notes.  Pertinent labs & imaging results that were available during my care of the patient were reviewed by me and considered in my medical decision making (see chart for details).     Images reviewed and discussed with patient.  She was advised in incentive spirometer use, ice therapy.  Hydrocodone and ibuprofen prescribed.  She is referred to try to tell pediatric medicine for follow-up care.  Discussed risks of pneumonia, advised immediate recheck for any fevers or worsening shortness of breath.  Final Clinical Impressions(s) / ED Diagnoses   Final diagnoses:  Closed fracture of multiple ribs of left side, initial encounter    New Prescriptions New Prescriptions   HYDROCODONE-ACETAMINOPHEN (NORCO/VICODIN) 5-325 MG TABLET    Take 1 tablet by mouth every 4 (four) hours as needed.   HYDROCODONE-ACETAMINOPHEN (NORCO/VICODIN) 5-325 MG TABLET    Take 1 tablet by mouth every 4 (four) hours as  needed.   IBUPROFEN (ADVIL,MOTRIN) 600 MG TABLET    Take 1 tablet (600 mg total) by mouth every 6 (six) hours as needed.     Burgess AmorJulie Heena Woodbury, PA-C 01/01/17 2101    Bethann BerkshireJoseph Zammit, MD 01/03/17 [phone]

## 2017-01-07 ENCOUNTER — Encounter (HOSPITAL_COMMUNITY): Payer: Self-pay | Admitting: Emergency Medicine

## 2017-01-07 ENCOUNTER — Emergency Department (HOSPITAL_COMMUNITY)
Admission: EM | Admit: 2017-01-07 | Discharge: 2017-01-07 | Disposition: A | Discharge status: Home / Self Care | Payer: Self-pay | Attending: Emergency Medicine | Admitting: Emergency Medicine

## 2017-01-07 DIAGNOSIS — F17210 Nicotine dependence, cigarettes, uncomplicated: Secondary | ICD-10-CM | POA: Insufficient documentation

## 2017-01-07 DIAGNOSIS — S2232XS Fracture of one rib, left side, sequela: Secondary | ICD-10-CM | POA: Insufficient documentation

## 2017-01-07 DIAGNOSIS — W2203XS Walked into furniture, sequela: Secondary | ICD-10-CM | POA: Insufficient documentation

## 2017-01-07 DIAGNOSIS — F909 Attention-deficit hyperactivity disorder, unspecified type: Secondary | ICD-10-CM | POA: Insufficient documentation

## 2017-01-07 DIAGNOSIS — Z79899 Other long term (current) drug therapy: Secondary | ICD-10-CM | POA: Insufficient documentation

## 2017-01-07 MED ORDER — HYDROCODONE-ACETAMINOPHEN 5-325 MG PO TABS: 2.0000 | ORAL_TABLET | ORAL | 0 refills | Status: DC | PRN

## 2017-01-07 MED FILL — Hydrocodone-Acetaminophen Tab 5-325 MG: ORAL | Qty: 6 | Status: AC

## 2017-01-07 NOTE — Discharge Instructions (Signed)
Schedule a primary care appointment

## 2017-01-07 NOTE — ED Triage Notes (Signed)
Patient states she was here last week and treated for broken left ribs. States she is out of her pain medication and is still hurting.

## 2017-01-07 NOTE — ED Provider Notes (Signed)
[facility] Provider Note   CSN: [id number] Arrival date & time: 01/07/17  1118   By signing my name below, I, Cynda Acres, attest that this documentation has been prepared under the direction and in the presence of Ok Edwards, PA-C  Electronically Signed: Cynda Acres, Scribe. 01/07/17. 1:14 PM.  History   Chief Complaint Chief Complaint  Patient presents with  . Rib Injury   HPI Comments: [place] is a 51 y.o. female with a hx of bipolar disorder, polysubstance abuse, and depression, who presents to the Emergency Department complaining of constant rib pain that began 9 days ago. Patient states she was here 7 days ago for fractured left ribs by leaning over a chair, in which she was prescribed hydrocodone. Patient states she ran out of her pain medication. Pain is worse with deep breathing. Patient states she cannot take ibuprofen due to her gallbladder.  Prior x-ray 01/01/17 revealed left eighth and ninth rib fractures anteriorly. Patient denies any other symptoms. Patient does not have a primary care physician.   The history is provided by the patient. No language interpreter was used.    Past Medical History:  Diagnosis Date  . Attention deficit hyperactivity disorder (ADHD)   . Bipolar 2 disorder (HCC)   . Depression   . Drug abuse     Patient Active Problem List   Diagnosis Date Noted  . ADHD (attention deficit hyperactivity disorder) 05/16/2012  . 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 Major depressive disorder, recurrent episode (HCC) 05/15/2012  . Generalized anxiety disorder 05/15/2012    Past Surgical History:  Procedure Laterality Date  . ABDOMINAL HYSTERECTOMY      OB History    No data available       Home Medications    Prior to Admission medications   Medication Sig Start Date End Date Taking? Authorizing Provider  HYDROcodone-acetaminophen (NORCO/VICODIN) 5-325 MG tablet Take 1 tablet by mouth every 4 (four) hours  as needed. 01/01/17   Burgess Amor, PA-C  HYDROcodone-acetaminophen (NORCO/VICODIN) 5-325 MG tablet Take 1 tablet by mouth every 4 (four) hours as needed. 01/01/17   Burgess Amor, PA-C  ibuprofen (ADVIL,MOTRIN) 600 MG tablet Take 1 tablet (600 mg total) by mouth every 6 (six) hours as needed. 01/01/17   Burgess Amor, PA-C  omeprazole (PRILOSEC) 20 MG capsule Take 1 capsule (20 mg total) by mouth 2 (two) times daily. Patient not taking: Reported on 08/03/2016 03/22/16   Rolland Porter, MD  oxyCODONE-acetaminophen (PERCOCET/ROXICET) 5-325 MG tablet Take 1 tablet by mouth every 6 (six) hours as needed. 11/06/16   Bethann Berkshire, MD  promethazine (PHENERGAN) 25 MG suppository Place 1 suppository (25 mg total) rectally every 6 (six) hours as needed for nausea or vomiting. 08/03/16   Azalia Bilis, MD  silver sulfADIAZINE (SILVADENE) 1 % cream Apply 1 application topically daily. 11/06/16   Bethann Berkshire, MD  silver sulfADIAZINE (SILVADENE) 1 % cream Apply 1 application topically daily. 11/06/16   Bethann Berkshire, MD  sucralfate (CARAFATE) 1 GM/10ML suspension Take 10 mLs (1 g total) by mouth 4 (four) times daily -  with meals and at bedtime. 08/03/16   Azalia Bilis, MD    Family History Family History  Problem Relation Age of Onset  . Cancer Mother   . Heart failure Father     Social History Social History  Substance Use Topics  . Smoking status: Current Every Day Smoker    Packs/day: 0.50    Years: 25.00  Types: Cigarettes  . Smokeless tobacco: Never Used  . Alcohol use No     Allergies   Compazine and Penicillins   Review of Systems Review of Systems  Musculoskeletal: Positive for arthralgias (Left-sided ribs).     Physical Exam Updated Vital Signs BP 154/90 (BP Location: Left Arm) Comment: Repeat  Pulse 86   Temp 98.9 F (37.2 C) (Oral)   Resp 20   SpO2 98%   Physical Exam  Constitutional: She is oriented to person, place, and time. She appears well-developed.  HENT:  Head: Normocephalic  and atraumatic.  Mouth/Throat: Oropharynx is clear and moist.  Eyes: Conjunctivae and EOM are normal. Pupils are equal, round, and reactive to light.  Neck: Normal range of motion. Neck supple.  Cardiovascular: Normal rate.   Pulmonary/Chest: Effort normal.  Abdominal: Soft. Bowel sounds are normal.  Musculoskeletal: Normal range of motion.  Tenderness to the left anterior ribs.   Neurological: She is alert and oriented to person, place, and time.  Skin: Skin is warm and dry.     ED Treatments / Results  DIAGNOSTIC STUDIES: Oxygen Saturation is 98% on RA, normal by my interpretation.    COORDINATION OF CARE: 1:13 PM Discussed treatment plan with pt at bedside and pt agreed to plan.  Labs (all labs ordered are listed, but only abnormal results are displayed) Labs Reviewed - No data to display  EKG  EKG Interpretation None       Radiology No results found.  Procedures Procedures (including critical care time)  Medications Ordered in ED Medications - No data to display   Initial Impression / Assessment and Plan / ED Course  I have reviewed the triage vital signs and the nursing notes.  Pertinent labs & imaging results that were available during my care of the patient were reviewed by me and considered in my medical decision making (see chart for details).       Final Clinical Impressions(s) / ED Diagnoses   Final diagnoses:  Closed fracture of one rib of left side, sequela    New Prescriptions Discharge Medication List as of 01/07/2017  1:15 PM      I personally performed the services in this documentation, which was scribed in my presence.  The recorded information has been reviewed and considered.   Barnet PallKaren SofiaPAC.   Lonia SkinnerLeslie K PinchSofia, PA-C 01/07/17 1610    Jacalyn LefevreJulie Haviland, MD 01/07/17 [phone]

## 2018-08-28 ENCOUNTER — Ambulatory Visit: Payer: Medicaid Other | Admitting: Podiatry

## 2018-08-29 ENCOUNTER — Ambulatory Visit (INDEPENDENT_AMBULATORY_CARE_PROVIDER_SITE_OTHER): Payer: Medicaid Other | Admitting: Family Medicine

## 2018-08-29 ENCOUNTER — Other Ambulatory Visit: Payer: Self-pay

## 2018-08-29 ENCOUNTER — Encounter: Payer: Self-pay | Admitting: Family Medicine

## 2018-08-29 VITALS — BP 110/68 | HR 81 | Temp 98.8°F | Wt 163.4 lb

## 2018-08-29 DIAGNOSIS — K219 Gastro-esophageal reflux disease without esophagitis: Secondary | ICD-10-CM | POA: Diagnosis not present

## 2018-08-29 DIAGNOSIS — M79672 Pain in left foot: Secondary | ICD-10-CM

## 2018-08-29 DIAGNOSIS — K8020 Calculus of gallbladder without cholecystitis without obstruction: Secondary | ICD-10-CM

## 2018-08-29 DIAGNOSIS — M79671 Pain in right foot: Secondary | ICD-10-CM

## 2018-08-29 DIAGNOSIS — Z0000 Encounter for general adult medical examination without abnormal findings: Secondary | ICD-10-CM

## 2018-08-29 LAB — POCT GLYCOSYLATED HEMOGLOBIN (HGB A1C): Hemoglobin A1C: 5.5 % (ref 4.0–5.6)

## 2018-08-29 MED ORDER — OMEPRAZOLE 20 MG PO CPDR: 20.0000 mg | DELAYED_RELEASE_CAPSULE | Freq: Every day | ORAL | 3 refills | Status: AC

## 2018-08-29 NOTE — Assessment & Plan Note (Addendum)
General blood work to evaluate for health problems given patient history and no routine blood work in over two years. - CBC, CMP, A1c, and Lipid panel

## 2018-08-29 NOTE — Assessment & Plan Note (Signed)
Refilling Omeprazole 20mg  daily

## 2018-08-29 NOTE — Assessment & Plan Note (Signed)
Referral to gen surg, likely cholecystectomy

## 2018-08-29 NOTE — Patient Instructions (Addendum)
It was great to meet you today! Thank you for letting me participate in your care!  Today, we discussed your overall health and goals for your healthcare. I am sending you to a podiatrist for better care and management of your bilateral foot pain. I am sending you to a general surgeon for possible gallbladder removal.  I am starting you on omeprazole for GERD. Please take one pill daily.  I will let you know if any of your blood work is abnormal.  Be well, Jules Schick, DO PGY-2, [facility]

## 2018-08-29 NOTE — Progress Notes (Signed)
Subjective: Chief Complaint  Patient presents with  . New Patient (Initial Visit)    HPI: [name] is a 52 y.o. presenting to clinic today to discuss the following:  New Patient/Establish Care Patient states she has recently qualified for disability and is now seeking medical care. She has a history of depression and bipolar disorder but has not taken any medication "for years". She does have an appointment with [facility] in October of this year. I advised patient to keep that appointment. She denies any intention to hurt herself or end her life today.  Patient is currently smoking 1/2 ppd and has been actively smoking since 1985. She wants to try and "quit on my own" but will contact me if she cannot and needs the aid of medications and nicotine replacement therapy. Declined both today.  Bilateral Foot Pain Patient states she has had "feet trouble" for years and pain while walking. She has multiple calluses on both feet. She also states she has developed "knots" on her feet and requests a referral to a podiatrist for evaluation.  RUQ Pain Patient had a RUQ ultrasound in 2017 that showed gallbladder sludge, a few stones, and no signs of cholecystitis. She denies any fever or abdominal pain at this time. She does have post prandial pain about 30 minutes after eating and especially if she consumes a meal high in fat.  PMH: Bipolar Type I, Depression, Cholelithasis, GERD  PSH: Hysterectomy 1996  FH: Depression, heart disease, cancer, HTN, Stroke, and DM  SH: Tobacco use disorder  Health Maintenance: declines flu shot today     ROS noted in HPI.   Past Medical, Surgical, Social, and Family History Reviewed & Updated per EMR.   Pertinent Historical Findings include:   Social History   Tobacco Use  Smoking Status Current Every Day Smoker  . Packs/day: 0.50  . Years: 25.00  . Pack years: 12.50  . Types: Cigarettes  Smokeless Tobacco Never Used     Objective: BP 110/68   Pulse 81   Temp 98.8 F (37.1 C) (Oral)   Wt 163 lb 6.4 oz (74.1 kg)   SpO2 98%   BMI 25.59 kg/m  Vitals and nursing notes reviewed  Physical Exam Gen: Alert and Oriented x 3, NAD HEENT: Normocephalic, atraumatic, PERRLA, EOMI, TM visible with good light reflex, non-swollen, non-erythematous turbinates, non-erythematous pharyngeal mucosa, no exudates Neck: trachea midline, no thyroidmegaly, no LAD CV: RRR, no murmurs, normal S1, S2 split Resp: CTAB, no wheezing, rales, or rhonchi, comfortable work of breathing Abd: non-distended, non-tender, soft, +bs in all four quadrants MSK: Moves all four extremities; bilateral feet have multiple thick calluses Ext: no clubbing, cyanosis, or edema Neuro: No gross deficits Skin: warm, dry, intact, no rashes  Results for orders placed or performed in visit on 08/29/18 (from the past 72 hour(s))  POCT glycosylated hemoglobin (Hb A1C)     Status: None   Collection Time: 08/29/18 10:55 AM  Result Value Ref Range   Hemoglobin A1C 5.5 4.0 - 5.6 %   HbA1c POC (<> result, manual entry)     HbA1c, POC (prediabetic range)     HbA1c, POC (controlled diabetic range)      Assessment/Plan:  Calculus of gallbladder without cholecystitis without obstruction Referral to gen surg, likely cholecystectomy  Bilateral foot pain Referral to podiatry at patient request.  Routine adult health maintenance General blood work to evaluate for health problems given patient history and no routine blood work in  over two years. - CBC, CMP, A1c, and Lipid panel  GERD (gastroesophageal reflux disease) Refilling Omeprazole 20mg  daily   PATIENT EDUCATION PROVIDED: See AVS    Diagnosis and plan along with any newly prescribed medication(s) were discussed in detail with this patient today. The patient verbalized understanding and agreed with the plan. Patient advised if symptoms worsen return to clinic or ER.   Health  Maintainance:   Orders Placed This Encounter  Procedures  . CBC  . Comprehensive metabolic panel  . Lipid Panel  . HIV antibody (with reflex)  . Ambulatory referral to Podiatry    Referral Priority:   Routine    Referral Type:   Consultation    Referral Reason:   Specialty Services Required    Requested Specialty:   Podiatry    Number of Visits Requested:   1  . Ambulatory referral to General Surgery    Referral Priority:   Routine    Referral Type:   Surgical    Referral Reason:   Specialty Services Required    Requested Specialty:   General Surgery    Number of Visits Requested:   1  . POCT glycosylated hemoglobin (Hb A1C)    Meds ordered this encounter  Medications  . omeprazole (PRILOSEC) 20 MG capsule    Sig: Take 1 capsule (20 mg total) by mouth daily.    Dispense:  30 capsule    Refill:  3     Tim Karen Chafe, DO 08/29/2018, 10:33 AM PGY-2 [facility]

## 2018-08-29 NOTE — Assessment & Plan Note (Signed)
Referral to podiatry at patient request.  

## 2018-08-30 LAB — COMPREHENSIVE METABOLIC PANEL
ALT: 11 IU/L (ref 0–32)
AST: 17 IU/L (ref 0–40)
Albumin/Globulin Ratio: 1.8 (ref 1.2–2.2)
Albumin: 4.9 g/dL (ref 3.5–5.5)
Alkaline Phosphatase: 118 IU/L — ABNORMAL HIGH (ref 39–117)
BUN / CREAT RATIO: 10 (ref 9–23)
BUN: 9 mg/dL (ref 6–24)
CALCIUM: 9.9 mg/dL (ref 8.7–10.2)
CO2: 24 mmol/L (ref 20–29)
Chloride: 101 mmol/L (ref 96–106)
Creatinine, Ser: 0.87 mg/dL (ref 0.57–1.00)
GFR calc Af Amer: 89 mL/min/{1.73_m2} (ref >59)
GFR calc non Af Amer: 77 mL/min/{1.73_m2} (ref >59)
GLUCOSE: 75 mg/dL (ref 65–99)
Globulin, Total: 2.8 g/dL (ref 1.5–4.5)
POTASSIUM: 4.2 mmol/L (ref 3.5–5.2)
Sodium: 142 mmol/L (ref 134–144)
TOTAL PROTEIN: 7.7 g/dL (ref 6.0–8.5)

## 2018-08-30 LAB — CBC
Hematocrit: 43.7 % (ref 34.0–46.6)
Hemoglobin: 14.4 g/dL (ref 11.1–15.9)
MCH: 26.5 pg — ABNORMAL LOW (ref 26.6–33.0)
MCHC: 33 g/dL (ref 31.5–35.7)
MCV: 81 fL (ref 79–97)
PLATELETS: 216 10*3/uL (ref 150–450)
RBC: 5.43 x10E6/uL — AB (ref 3.77–5.28)
RDW: 15.3 % (ref 12.3–15.4)
WBC: 7 10*3/uL (ref 3.4–10.8)

## 2018-08-30 LAB — LIPID PANEL
CHOL/HDL RATIO: 5.9 ratio — AB (ref 0.0–4.4)
CHOLESTEROL TOTAL: 211 mg/dL — AB (ref 100–199)
HDL: 36 mg/dL — ABNORMAL LOW (ref >39)
LDL CALC: 151 mg/dL — AB (ref 0–99)
TRIGLYCERIDES: 122 mg/dL (ref 0–149)
VLDL Cholesterol Cal: 24 mg/dL (ref 5–40)

## 2018-08-30 LAB — HIV ANTIBODY (ROUTINE TESTING W REFLEX): HIV Screen 4th Generation wRfx: NONREACTIVE

## 2018-09-02 ENCOUNTER — Other Ambulatory Visit: Payer: Self-pay | Admitting: Family Medicine

## 2018-09-02 ENCOUNTER — Encounter: Payer: Self-pay | Admitting: Family Medicine

## 2018-09-02 MED ORDER — PRAVASTATIN SODIUM 40 MG PO TABS: 40.0000 mg | ORAL_TABLET | Freq: Every day | ORAL | 3 refills | Status: AC

## 2018-09-02 NOTE — Progress Notes (Signed)
Starting Pravastatin 40mg  due to elevated LDL

## 2018-09-02 NOTE — Progress Notes (Signed)
Sending letter to patient with essentially normal results  LDL is high. Called patient and discussed starting a statin. I will send pravastatin 40mg  to pharmacy.

## 2018-09-11 ENCOUNTER — Ambulatory Visit: Payer: Medicaid Other

## 2018-09-11 ENCOUNTER — Encounter (INDEPENDENT_AMBULATORY_CARE_PROVIDER_SITE_OTHER): Payer: Medicaid Other | Admitting: Podiatry

## 2018-09-11 DIAGNOSIS — M779 Enthesopathy, unspecified: Secondary | ICD-10-CM

## 2018-09-11 DIAGNOSIS — M778 Other enthesopathies, not elsewhere classified: Secondary | ICD-10-CM

## 2018-09-11 NOTE — Progress Notes (Signed)
This encounter was created in error - please disregard.

## 2018-09-12 ENCOUNTER — Ambulatory Visit: Payer: Self-pay | Admitting: Surgery

## 2018-09-12 NOTE — H&P (Signed)
[name] Documented: 09/12/2018 9:56 AM Location: [facility] Patient #: [id number] DOB: Dec 09, 1965 Undefined / Language: [place] / Race: White Female  History of Present Illness ([name] A. Gloria Bonine [name]; 09/12/2018 10:15 AM) Patient words: A pleasant 52 year old woman who is referred for biliary colic. She has been having symptoms for at least a year and these include postprandial epigastric and right upper quadrant pain, nausea and occasional emesis. She reports no changes in her bowel movements. Denies fevers or jaundice. No previous colonoscopy or upper endoscopy. She recently has been able to go on disability for mental health reasons and has started to seek medical treatment and general health care. She has a known history of gallstones, last imaging available in our system as a CT scan in September 2017 when she was seen in the emergency room for the same. Most recent labs were done on August 29, 2018 and include a CMP which is unremarkable as well as a CBC which is also unremarkable.  She has a history of depression and bipolar disorder, not on any medication but will be seeing psychology this month. History also includes GERD. Abdominal surgical history notable for hysterectomy via Pfannenstiel in 1996. She is an active smoker about a pack per day for the last 35 years.  The patient is a 52 year old female.   Allergies [name], [place]; 09/12/2018 9:57 AM) Penicillin G Pot in Dextrose *PENICILLINS* Compazine *ANTIPSYCHOTICS/ANTIMANIC AGENTS* Allergies Reconciled  Medication History [name], [name]; 09/12/2018 10:00 AM) Omeprazole (20MG  Capsule DR, Oral) Active. Pravastatin Sodium (40MG  Tablet, Oral) Active. Medications Reconciled     Review of Systems ([name] A. Gloria Bonine [name]; 09/12/2018 10:15 AM) All other systems negative  Vitals [name]; 09/12/2018 9:57 AM) 09/12/2018 9:56 AM Weight: 167.6 lb Height: 67in Body Surface  Area: 1.88 m Body Mass Index: 26.25 kg/m  Temp.: 97.61F  Pulse: 97 (Regular)  BP: 148/92 (Sitting, Left Arm, Standard)      Physical Exam ([name]; 09/12/2018 10:16 AM)  The physical exam findings are as follows: Note:Gen: alert cooperative, appears older than stated age Eye: extraocular motion intact, no scleral icterus ENT: moist mucus membranes, poor dentition Neck: no mass or thyromegaly Chest: unlabored respirations, symmetrical air entry, clear bilaterally CV: regular rate and rhythm, no pedal edema Abdomen: soft, nontender, nondistended. No mass or organomegaly MSK: strength symmetrical throughout, no deformity Neuro: grossly intact, normal gait Psych: normal mood and affect, appropriate insight Skin: warm and dry, no rash or lesion on limited exam    Assessment & Plan ([name] A. Gloria Bonine [name]; 09/12/2018 10:16 AM)  BILIARY COLIC (K80.50) Story: I recommend proceeding with laparoscopic cholecystectomy with possible cholangiogram. Discussed risks of surgery including bleeding, pain, scarring, intraabdominal injury specifically to the common bile duct and sequelae, conversion to open surgery, failure to resolve symptoms, blood clots/ pulmonary embolus, heart attack, pneumonia, stroke, death. Questions welcomed and answered to patient's satisfaction. She would like to proceed with surgery as soon as possible.

## 2018-10-02 ENCOUNTER — Encounter (INDEPENDENT_AMBULATORY_CARE_PROVIDER_SITE_OTHER): Payer: Medicaid Other | Admitting: Podiatry

## 2018-10-02 ENCOUNTER — Ambulatory Visit: Payer: Medicaid Other

## 2018-10-02 DIAGNOSIS — M7750 Other enthesopathy of unspecified foot: Secondary | ICD-10-CM

## 2018-10-02 NOTE — Progress Notes (Signed)
This encounter was created in error - please disregard.

## 2018-10-02 NOTE — Pre-Procedure Instructions (Signed)
[place]  10/02/2018      [id number] [place] Otto, [place] - [address] AT [facility] OF [address] [place] [address] Phone: [phone] Fax: [phone]    Your procedure is scheduled on October 09, 2018.  Report to [facility] Admitting at 530 AM.  Call this number if you have problems the morning of surgery:  [phone]   Remember:  Do not eat or drink after midnight.    Take these medicines the morning of surgery with A SIP OF WATER  Gabapentin (neurontin) Lamotrigine (lamictal) Buprenorphine HCL-Naloxone film  7 days prior to surgery STOP taking any Aspirin (unless otherwise instructed by your surgeon), Aleve, Naproxen, Ibuprofen, Motrin, Advil, Goody's, BC's, all herbal medications, fish oil, and all vitamins   Do not wear jewelry, make-up or nail polish.  Do not wear lotions, powders, or perfumes, or deodorant.  Do not shave 48 hours prior to surgery.    Do not bring valuables to the hospital.  [facility] is not responsible for any belongings or valuables.  Contacts, dentures or bridgework may not be worn into surgery.  Leave your suitcase in the car.  After surgery it may be brought to your room.  For patients admitted to the hospital, discharge time will be determined by your treatment team.  Patients discharged the day of surgery will not be allowed to drive home.    [facility]- Preparing For Surgery  Before surgery, you can play an important role. Because skin is not sterile, your skin needs to be as free of germs as possible. You can reduce the number of germs on your skin by washing with CHG (chlorahexidine gluconate) Soap before surgery.  CHG is an antiseptic cleaner which kills germs and bonds with the skin to continue killing germs even after washing.    Oral Hygiene is also important to reduce your risk of infection.  Remember - BRUSH YOUR TEETH THE MORNING OF SURGERY WITH  YOUR REGULAR TOOTHPASTE  Please do not use if you have an allergy to CHG or antibacterial soaps. If your skin becomes reddened/irritated stop using the CHG.  Do not shave (including legs and underarms) for at least 48 hours prior to first CHG shower. It is OK to shave your face.  Please follow these instructions carefully.   1. Shower the NIGHT BEFORE SURGERY and the MORNING OF SURGERY with CHG.   2. If you chose to wash your hair, wash your hair first as usual with your normal shampoo.  3. After you shampoo, rinse your hair and body thoroughly to remove the shampoo.  4. Use CHG as you would any other liquid soap. You can apply CHG directly to the skin and wash gently with a scrungie or a clean washcloth.   5. Apply the CHG Soap to your body ONLY FROM THE NECK DOWN.  Do not use on open wounds or open sores. Avoid contact with your eyes, ears, mouth and genitals (private parts). Wash Face and genitals (private parts)  with your normal soap.  6. Wash thoroughly, paying special attention to the area where your surgery will be performed.  7. Thoroughly rinse your body with warm water from the neck down.  8. DO NOT shower/wash with your normal soap after using and rinsing off the CHG Soap.  9. Pat yourself dry with a CLEAN TOWEL.  10. Wear CLEAN PAJAMAS to bed the night before  surgery, wear comfortable clothes the morning of surgery  11. Place CLEAN SHEETS on your bed the night of your first shower and DO NOT SLEEP WITH PETS.  Day of Surgery:  Do not apply any deodorants/lotions.  Please wear clean clothes to the hospital/surgery center.   Remember to brush your teeth WITH YOUR REGULAR TOOTHPASTE.  Please read over the fact sheets that you were given.

## 2018-10-03 ENCOUNTER — Encounter (HOSPITAL_COMMUNITY)
Admission: RE | Admit: 2018-10-03 | Discharge: 2018-10-03 | Disposition: A | Discharge status: Home / Self Care | Payer: Medicaid Other | Source: Ambulatory Visit | Attending: Surgery | Admitting: Surgery

## 2018-10-03 ENCOUNTER — Ambulatory Visit (HOSPITAL_COMMUNITY)
Admission: RE | Admit: 2018-10-03 | Discharge: 2018-10-03 | Disposition: A | Discharge status: Home / Self Care | Payer: Medicaid Other | Source: Ambulatory Visit | Attending: Surgery | Admitting: Surgery

## 2018-10-03 ENCOUNTER — Other Ambulatory Visit: Payer: Self-pay

## 2018-10-03 ENCOUNTER — Encounter (HOSPITAL_COMMUNITY): Payer: Self-pay

## 2018-10-03 DIAGNOSIS — Z01810 Encounter for preprocedural cardiovascular examination: Secondary | ICD-10-CM

## 2018-10-03 DIAGNOSIS — Z01818 Encounter for other preprocedural examination: Secondary | ICD-10-CM | POA: Diagnosis present

## 2018-10-03 HISTORY — DX: Personal history of urinary calculi: Z87.442

## 2018-10-03 HISTORY — DX: Gastro-esophageal reflux disease without esophagitis: K21.9

## 2018-10-03 LAB — COMPREHENSIVE METABOLIC PANEL
ALK PHOS: 110 U/L (ref 38–126)
ALT: 16 U/L (ref 0–44)
ANION GAP: 6 (ref 5–15)
AST: 23 U/L (ref 15–41)
Albumin: 4.2 g/dL (ref 3.5–5.0)
BILIRUBIN TOTAL: 0.4 mg/dL (ref 0.3–1.2)
BUN: 9 mg/dL (ref 6–20)
CALCIUM: 9.7 mg/dL (ref 8.9–10.3)
CO2: 30 mmol/L (ref 22–32)
Chloride: 104 mmol/L (ref 98–111)
Creatinine, Ser: 0.87 mg/dL (ref 0.44–1.00)
GFR calc Af Amer: >60 mL/min (ref >60)
Glucose, Bld: 76 mg/dL (ref 70–99)
Potassium: 4.2 mmol/L (ref 3.5–5.1)
SODIUM: 140 mmol/L (ref 135–145)
TOTAL PROTEIN: 7.5 g/dL (ref 6.5–8.1)

## 2018-10-03 LAB — CBC WITH DIFFERENTIAL/PLATELET
Abs Immature Granulocytes: 0.02 10*3/uL (ref 0.00–0.07)
Basophils Absolute: 0.1 10*3/uL (ref 0.0–0.1)
Basophils Relative: 1 %
Eosinophils Absolute: 0.1 10*3/uL (ref 0.0–0.5)
Eosinophils Relative: 2 %
HEMATOCRIT: 47.2 % — AB (ref 36.0–46.0)
HEMOGLOBIN: 14.3 g/dL (ref 12.0–15.0)
Immature Granulocytes: 0 %
LYMPHS PCT: 28 %
Lymphs Abs: 2.1 10*3/uL (ref 0.7–4.0)
MCH: 25.4 pg — ABNORMAL LOW (ref 26.0–34.0)
MCHC: 30.3 g/dL (ref 30.0–36.0)
MCV: 84 fL (ref 80.0–100.0)
Monocytes Absolute: 0.4 10*3/uL (ref 0.1–1.0)
Monocytes Relative: 6 %
Neutro Abs: 4.8 10*3/uL (ref 1.7–7.7)
Neutrophils Relative %: 63 %
Platelets: 230 10*3/uL (ref 150–400)
RBC: 5.62 MIL/uL — AB (ref 3.87–5.11)
RDW: 13.5 % (ref 11.5–15.5)
WBC: 7.5 10*3/uL (ref 4.0–10.5)
nRBC: 0 % (ref 0.0–0.2)

## 2018-10-03 NOTE — Progress Notes (Signed)
PCP: Arlyce Harman, DO  Cardiologist: denies  EKG: 10/03/18 -at PAT appt  Stress test: pt denies  ECHO: pt denies  Cardiac Cath: pt denies  Chest x-ray: 10/03/18 at PAT appt.

## 2018-10-08 ENCOUNTER — Encounter (HOSPITAL_COMMUNITY): Payer: Self-pay | Admitting: Anesthesiology

## 2018-10-08 MED ORDER — BUPIVACAINE LIPOSOME 1.3 % IJ SUSP
20.0000 mL | INTRAMUSCULAR | Status: DC
  Filled 2018-10-08: qty 20

## 2018-10-08 NOTE — Anesthesia Preprocedure Evaluation (Deleted)
Anesthesia Evaluation    Reviewed: Allergy & Precautions, Patient's Chart, lab work & pertinent test results  History of Anesthesia Complications Negative for: history of anesthetic complications  Airway        Dental   Pulmonary Current Smoker,           Cardiovascular negative cardio ROS       Neuro/Psych PSYCHIATRIC DISORDERS Anxiety Depression Bipolar Disorder  Insomnia negative neurological ROS     GI/Hepatic GERD  Medicated,(+)     substance abuse  ,  Suboxone use   Biliary colic    Endo/Other  negative endocrine ROS  Renal/GU negative Renal ROS     Musculoskeletal negative musculoskeletal ROS (+) narcotic dependent  Abdominal   Peds  (+) ADHD Hematology negative hematology ROS (+)   Anesthesia Other Findings   Reproductive/Obstetrics  S/p hysterectomy                              Anesthesia Physical Anesthesia Plan  ASA: II  Anesthesia Plan: General   Post-op Pain Management:    Induction: Intravenous  PONV Risk Score and Plan: 4 or greater and Treatment may vary due to age or medical condition, Ondansetron, Scopolamine patch - Pre-op, Midazolam and Dexamethasone  Airway Management Planned: Oral ETT  Additional Equipment: None  Intra-op Plan:   Post-operative Plan: Extubation in OR  Informed Consent:   Plan Discussed with: CRNA and Anesthesiologist  Anesthesia Plan Comments: ( Will utilize multimodal analgesics to minimize narcotic requirement given drug use hx and suboxone use)        Anesthesia Quick Evaluation

## 2018-10-09 ENCOUNTER — Ambulatory Visit (HOSPITAL_COMMUNITY): Admission: RE | Admit: 2018-10-09 | Payer: Medicaid Other | Source: Ambulatory Visit | Admitting: Surgery

## 2018-10-09 SURGERY — LAPAROSCOPIC CHOLECYSTECTOMY
Anesthesia: General

## 2018-10-17 ENCOUNTER — Ambulatory Visit: Payer: Self-pay | Admitting: Family Medicine

## 2018-11-02 DEATH — deceased

## 2018-11-21 ENCOUNTER — Ambulatory Visit: Payer: Self-pay | Admitting: Family Medicine
